# Patient Record
Sex: Male | Born: 1970 | Race: Black or African American | Hispanic: No | Marital: Single | Smoking: Never smoker
Health system: Southern US, Community
[De-identification: ages and names within clinical notes are randomized; demographics above are authoritative.]

## PROBLEM LIST (undated history)

## (undated) DIAGNOSIS — I10 Essential (primary) hypertension: Secondary | ICD-10-CM

## (undated) DIAGNOSIS — K642 Third degree hemorrhoids: Secondary | ICD-10-CM

## (undated) DIAGNOSIS — Z87828 Personal history of other (healed) physical injury and trauma: Secondary | ICD-10-CM

## (undated) DIAGNOSIS — F419 Anxiety disorder, unspecified: Secondary | ICD-10-CM

## (undated) DIAGNOSIS — G4700 Insomnia, unspecified: Secondary | ICD-10-CM

## (undated) DIAGNOSIS — K5090 Crohn's disease, unspecified, without complications: Secondary | ICD-10-CM

## (undated) DIAGNOSIS — M1990 Unspecified osteoarthritis, unspecified site: Secondary | ICD-10-CM

---
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
## 2016-05-26 DIAGNOSIS — K5090 Crohn's disease, unspecified, without complications: Secondary | ICD-10-CM | POA: Diagnosis present

## 2016-05-26 DIAGNOSIS — K642 Third degree hemorrhoids: Secondary | ICD-10-CM

## 2016-05-26 DIAGNOSIS — K648 Other hemorrhoids: Secondary | ICD-10-CM | POA: Insufficient documentation

## 2016-05-26 HISTORY — PX: FLEXIBLE SIGMOIDOSCOPY: SHX1649

## 2016-05-26 HISTORY — DX: Third degree hemorrhoids: K64.2

## 2019-07-11 ENCOUNTER — Emergency Department (HOSPITAL_COMMUNITY): Payer: Medicaid - Out of State

## 2019-07-11 ENCOUNTER — Inpatient Hospital Stay (HOSPITAL_COMMUNITY): Payer: Medicaid - Out of State

## 2019-07-11 ENCOUNTER — Encounter (HOSPITAL_COMMUNITY): Payer: Self-pay

## 2019-07-11 ENCOUNTER — Other Ambulatory Visit: Payer: Self-pay

## 2019-07-11 ENCOUNTER — Inpatient Hospital Stay (HOSPITAL_COMMUNITY)
Admission: EM | Admit: 2019-07-11 | Discharge: 2019-07-26 | DRG: 654 | Disposition: A | Discharge status: Home / Self Care | Payer: Medicaid - Out of State | Attending: General Surgery | Admitting: General Surgery

## 2019-07-11 DIAGNOSIS — Z8719 Personal history of other diseases of the digestive system: Secondary | ICD-10-CM | POA: Diagnosis not present

## 2019-07-11 DIAGNOSIS — K567 Ileus, unspecified: Secondary | ICD-10-CM | POA: Diagnosis not present

## 2019-07-11 DIAGNOSIS — Y92230 Patient room in hospital as the place of occurrence of the external cause: Secondary | ICD-10-CM | POA: Diagnosis not present

## 2019-07-11 DIAGNOSIS — R109 Unspecified abdominal pain: Secondary | ICD-10-CM | POA: Diagnosis not present

## 2019-07-11 DIAGNOSIS — T8149XA Infection following a procedure, other surgical site, initial encounter: Secondary | ICD-10-CM | POA: Diagnosis not present

## 2019-07-11 DIAGNOSIS — K50912 Crohn's disease, unspecified, with intestinal obstruction: Secondary | ICD-10-CM | POA: Diagnosis present

## 2019-07-11 DIAGNOSIS — K56699 Other intestinal obstruction unspecified as to partial versus complete obstruction: Secondary | ICD-10-CM | POA: Diagnosis present

## 2019-07-11 DIAGNOSIS — Z87898 Personal history of other specified conditions: Secondary | ICD-10-CM

## 2019-07-11 DIAGNOSIS — K648 Other hemorrhoids: Secondary | ICD-10-CM | POA: Diagnosis present

## 2019-07-11 DIAGNOSIS — R509 Fever, unspecified: Secondary | ICD-10-CM | POA: Diagnosis not present

## 2019-07-11 DIAGNOSIS — Z95828 Presence of other vascular implants and grafts: Secondary | ICD-10-CM | POA: Diagnosis not present

## 2019-07-11 DIAGNOSIS — Z4659 Encounter for fitting and adjustment of other gastrointestinal appliance and device: Secondary | ICD-10-CM

## 2019-07-11 DIAGNOSIS — K561 Intussusception: Secondary | ICD-10-CM | POA: Diagnosis present

## 2019-07-11 DIAGNOSIS — R140 Abdominal distension (gaseous): Secondary | ICD-10-CM

## 2019-07-11 DIAGNOSIS — M1990 Unspecified osteoarthritis, unspecified site: Secondary | ICD-10-CM | POA: Diagnosis present

## 2019-07-11 DIAGNOSIS — R1909 Other intra-abdominal and pelvic swelling, mass and lump: Secondary | ICD-10-CM

## 2019-07-11 DIAGNOSIS — E46 Unspecified protein-calorie malnutrition: Secondary | ICD-10-CM | POA: Diagnosis not present

## 2019-07-11 DIAGNOSIS — Z0189 Encounter for other specified special examinations: Secondary | ICD-10-CM

## 2019-07-11 DIAGNOSIS — K56609 Unspecified intestinal obstruction, unspecified as to partial versus complete obstruction: Secondary | ICD-10-CM

## 2019-07-11 DIAGNOSIS — Z20828 Contact with and (suspected) exposure to other viral communicable diseases: Secondary | ICD-10-CM | POA: Diagnosis present

## 2019-07-11 DIAGNOSIS — R066 Hiccough: Secondary | ICD-10-CM | POA: Diagnosis not present

## 2019-07-11 DIAGNOSIS — I10 Essential (primary) hypertension: Secondary | ICD-10-CM | POA: Diagnosis present

## 2019-07-11 DIAGNOSIS — R61 Generalized hyperhidrosis: Secondary | ICD-10-CM | POA: Diagnosis not present

## 2019-07-11 DIAGNOSIS — T8131XA Disruption of external operation (surgical) wound, not elsewhere classified, initial encounter: Secondary | ICD-10-CM | POA: Diagnosis not present

## 2019-07-11 DIAGNOSIS — K5090 Crohn's disease, unspecified, without complications: Secondary | ICD-10-CM | POA: Diagnosis present

## 2019-07-11 DIAGNOSIS — K565 Intestinal adhesions [bands], unspecified as to partial versus complete obstruction: Secondary | ICD-10-CM | POA: Diagnosis present

## 2019-07-11 DIAGNOSIS — N321 Vesicointestinal fistula: Principal | ICD-10-CM | POA: Diagnosis present

## 2019-07-11 DIAGNOSIS — T8140XA Infection following a procedure, unspecified, initial encounter: Secondary | ICD-10-CM | POA: Diagnosis not present

## 2019-07-11 DIAGNOSIS — T8141XA Infection following a procedure, superficial incisional surgical site, initial encounter: Secondary | ICD-10-CM | POA: Diagnosis not present

## 2019-07-11 DIAGNOSIS — Y836 Removal of other organ (partial) (total) as the cause of abnormal reaction of the patient, or of later complication, without mention of misadventure at the time of the procedure: Secondary | ICD-10-CM | POA: Diagnosis not present

## 2019-07-11 DIAGNOSIS — Z886 Allergy status to analgesic agent status: Secondary | ICD-10-CM | POA: Diagnosis not present

## 2019-07-11 DIAGNOSIS — Z87828 Personal history of other (healed) physical injury and trauma: Secondary | ICD-10-CM

## 2019-07-11 DIAGNOSIS — Z933 Colostomy status: Secondary | ICD-10-CM | POA: Diagnosis not present

## 2019-07-11 DIAGNOSIS — F411 Generalized anxiety disorder: Secondary | ICD-10-CM | POA: Diagnosis present

## 2019-07-11 DIAGNOSIS — R1013 Epigastric pain: Secondary | ICD-10-CM | POA: Diagnosis present

## 2019-07-11 DIAGNOSIS — R1900 Intra-abdominal and pelvic swelling, mass and lump, unspecified site: Secondary | ICD-10-CM | POA: Diagnosis present

## 2019-07-11 DIAGNOSIS — Z9225 Personal history of immunosupression therapy: Secondary | ICD-10-CM

## 2019-07-11 HISTORY — DX: Unspecified osteoarthritis, unspecified site: M19.90

## 2019-07-11 HISTORY — DX: Personal history of other (healed) physical injury and trauma: Z87.828

## 2019-07-11 LAB — CBC WITH DIFFERENTIAL/PLATELET
Abs Immature Granulocytes: 0.05 10*3/uL (ref 0.00–0.07)
Basophils Absolute: 0 10*3/uL (ref 0.0–0.1)
Basophils Relative: 0 %
Eosinophils Absolute: 0.1 10*3/uL (ref 0.0–0.5)
Eosinophils Relative: 0 %
HCT: 50.4 % (ref 39.0–52.0)
Hemoglobin: 16.7 g/dL (ref 13.0–17.0)
Immature Granulocytes: 0 %
Lymphocytes Relative: 2 %
Lymphs Abs: 0.4 10*3/uL — ABNORMAL LOW (ref 0.7–4.0)
MCH: 28 pg (ref 26.0–34.0)
MCHC: 33.1 g/dL (ref 30.0–36.0)
MCV: 84.6 fL (ref 80.0–100.0)
Monocytes Absolute: 1 10*3/uL (ref 0.1–1.0)
Monocytes Relative: 7 %
Neutro Abs: 13.4 10*3/uL — ABNORMAL HIGH (ref 1.7–7.7)
Neutrophils Relative %: 91 %
Platelets: 269 10*3/uL (ref 150–400)
RBC: 5.96 MIL/uL — ABNORMAL HIGH (ref 4.22–5.81)
RDW: 13.3 % (ref 11.5–15.5)
WBC: 15 10*3/uL — ABNORMAL HIGH (ref 4.0–10.5)
nRBC: 0 % (ref 0.0–0.2)

## 2019-07-11 LAB — CBC
HCT: 48.3 % (ref 39.0–52.0)
Hemoglobin: 16.3 g/dL (ref 13.0–17.0)
MCH: 28.2 pg (ref 26.0–34.0)
MCHC: 33.7 g/dL (ref 30.0–36.0)
MCV: 83.6 fL (ref 80.0–100.0)
Platelets: 253 10*3/uL (ref 150–400)
RBC: 5.78 MIL/uL (ref 4.22–5.81)
RDW: 13.2 % (ref 11.5–15.5)
WBC: 11 10*3/uL — ABNORMAL HIGH (ref 4.0–10.5)
nRBC: 0 % (ref 0.0–0.2)

## 2019-07-11 LAB — SARS CORONAVIRUS 2 (TAT 6-24 HRS): SARS Coronavirus 2: NEGATIVE

## 2019-07-11 LAB — COMPREHENSIVE METABOLIC PANEL
ALT: 18 U/L (ref 0–44)
AST: 24 U/L (ref 15–41)
Albumin: 4.2 g/dL (ref 3.5–5.0)
Alkaline Phosphatase: 60 U/L (ref 38–126)
Anion gap: 10 (ref 5–15)
BUN: 10 mg/dL (ref 6–20)
CO2: 22 mmol/L (ref 22–32)
Calcium: 9.6 mg/dL (ref 8.9–10.3)
Chloride: 108 mmol/L (ref 98–111)
Creatinine, Ser: 1.09 mg/dL (ref 0.61–1.24)
GFR calc Af Amer: >60 mL/min (ref >60)
GFR calc non Af Amer: >60 mL/min (ref >60)
Glucose, Bld: 148 mg/dL — ABNORMAL HIGH (ref 70–99)
Potassium: 4.3 mmol/L (ref 3.5–5.1)
Sodium: 140 mmol/L (ref 135–145)
Total Bilirubin: 0.8 mg/dL (ref 0.3–1.2)
Total Protein: 8.7 g/dL — ABNORMAL HIGH (ref 6.5–8.1)

## 2019-07-11 LAB — LIPASE, BLOOD: Lipase: 28 U/L (ref 11–51)

## 2019-07-11 LAB — URINALYSIS, ROUTINE W REFLEX MICROSCOPIC
Bilirubin Urine: NEGATIVE
Glucose, UA: NEGATIVE mg/dL
Hgb urine dipstick: NEGATIVE
Ketones, ur: 20 mg/dL — AB
Leukocytes,Ua: NEGATIVE
Nitrite: NEGATIVE
Protein, ur: NEGATIVE mg/dL
Specific Gravity, Urine: >1.046 — ABNORMAL HIGH (ref 1.005–1.030)
pH: 5 (ref 5.0–8.0)

## 2019-07-11 MED ORDER — MORPHINE SULFATE (PF) 4 MG/ML IV SOLN
4.0000 mg | Freq: Once | INTRAVENOUS | Status: AC
  Administered 2019-07-11: 4 mg via INTRAVENOUS
  Filled 2019-07-11: qty 1

## 2019-07-11 MED ORDER — METHOCARBAMOL 1000 MG/10ML IJ SOLN
500.0000 mg | Freq: Three times a day (TID) | INTRAVENOUS | Status: DC
  Administered 2019-07-11 – 2019-07-12 (×3): 500 mg via INTRAVENOUS
  Filled 2019-07-11 (×3): qty 5
  Filled 2019-07-11 (×3): qty 500
  Filled 2019-07-11 (×2): qty 5

## 2019-07-11 MED ORDER — ENOXAPARIN SODIUM 40 MG/0.4ML ~~LOC~~ SOLN: 40.0000 mg | SUBCUTANEOUS | Status: DC

## 2019-07-11 MED ORDER — SODIUM CHLORIDE 0.9 % IV SOLN
INTRAVENOUS | Status: DC
  Administered 2019-07-11: 1000 mL via INTRAVENOUS
  Administered 2019-07-11 – 2019-07-12 (×4): via INTRAVENOUS

## 2019-07-11 MED ORDER — ENOXAPARIN SODIUM 40 MG/0.4ML ~~LOC~~ SOLN
40.0000 mg | Freq: Every day | SUBCUTANEOUS | Status: DC
  Administered 2019-07-11: 40 mg via SUBCUTANEOUS
  Filled 2019-07-11: qty 0.4

## 2019-07-11 MED ORDER — HYDRALAZINE HCL 20 MG/ML IJ SOLN
10.0000 mg | INTRAMUSCULAR | Status: AC | PRN
  Administered 2019-07-12 – 2019-07-21 (×4): 10 mg via INTRAVENOUS
  Filled 2019-07-11 (×4): qty 1

## 2019-07-11 MED ORDER — MORPHINE SULFATE (PF) 2 MG/ML IV SOLN
1.0000 mg | INTRAVENOUS | Status: DC | PRN
  Administered 2019-07-11 (×2): 2 mg via INTRAVENOUS
  Filled 2019-07-11 (×2): qty 1

## 2019-07-11 MED ORDER — ACETAMINOPHEN 650 MG RE SUPP: 650.0000 mg | Freq: Four times a day (QID) | RECTAL | Status: DC | PRN

## 2019-07-11 MED ORDER — ONDANSETRON HCL 4 MG/2ML IJ SOLN
4.0000 mg | Freq: Four times a day (QID) | INTRAMUSCULAR | Status: DC | PRN
  Administered 2019-07-12 – 2019-07-20 (×2): 4 mg via INTRAVENOUS
  Filled 2019-07-11 (×2): qty 2

## 2019-07-11 MED ORDER — OXYCODONE HCL 5 MG PO TABS: 5.0000 mg | ORAL_TABLET | ORAL | Status: DC | PRN

## 2019-07-11 MED ORDER — SODIUM CHLORIDE 0.9 % IV BOLUS
1000.0000 mL | Freq: Once | INTRAVENOUS | Status: AC
  Administered 2019-07-11: 1000 mL via INTRAVENOUS

## 2019-07-11 MED ORDER — ONDANSETRON 4 MG PO TBDP: 4.0000 mg | ORAL_TABLET | Freq: Four times a day (QID) | ORAL | Status: DC | PRN

## 2019-07-11 MED ORDER — SODIUM CHLORIDE (PF) 0.9 % IJ SOLN
INTRAMUSCULAR | Status: AC
  Filled 2019-07-11: qty 50

## 2019-07-11 MED ORDER — METHOCARBAMOL 500 MG PO TABS: 500.0000 mg | ORAL_TABLET | Freq: Four times a day (QID) | ORAL | Status: DC | PRN

## 2019-07-11 MED ORDER — FAMOTIDINE IN NACL 20-0.9 MG/50ML-% IV SOLN
20.0000 mg | Freq: Once | INTRAVENOUS | Status: AC
  Administered 2019-07-11: 20 mg via INTRAVENOUS
  Filled 2019-07-11: qty 50

## 2019-07-11 MED ORDER — ACETAMINOPHEN 325 MG PO TABS: 650.0000 mg | ORAL_TABLET | Freq: Four times a day (QID) | ORAL | Status: DC | PRN

## 2019-07-11 MED ORDER — METOCLOPRAMIDE HCL 5 MG/ML IJ SOLN
10.0000 mg | Freq: Once | INTRAMUSCULAR | Status: AC
  Administered 2019-07-11: 10 mg via INTRAVENOUS
  Filled 2019-07-11: qty 2

## 2019-07-11 MED ORDER — DIPHENHYDRAMINE HCL 50 MG/ML IJ SOLN: 25.0000 mg | Freq: Four times a day (QID) | INTRAMUSCULAR | Status: DC | PRN

## 2019-07-11 MED ORDER — ONDANSETRON HCL 4 MG/2ML IJ SOLN
4.0000 mg | Freq: Once | INTRAMUSCULAR | Status: AC | PRN
  Administered 2019-07-11: 4 mg via INTRAVENOUS
  Filled 2019-07-11: qty 2

## 2019-07-11 MED ORDER — ACETAMINOPHEN 10 MG/ML IV SOLN
1000.0000 mg | Freq: Four times a day (QID) | INTRAVENOUS | Status: AC
  Administered 2019-07-11 – 2019-07-12 (×3): 1000 mg via INTRAVENOUS
  Filled 2019-07-11 (×4): qty 100

## 2019-07-11 MED ORDER — IOHEXOL 300 MG/ML  SOLN
100.0000 mL | Freq: Once | INTRAMUSCULAR | Status: AC | PRN
  Administered 2019-07-11: 100 mL via INTRAVENOUS

## 2019-07-11 MED ORDER — DIPHENHYDRAMINE HCL 50 MG/ML IJ SOLN
25.0000 mg | Freq: Once | INTRAMUSCULAR | Status: AC
  Administered 2019-07-11: 25 mg via INTRAVENOUS
  Filled 2019-07-11: qty 1

## 2019-07-11 MED ORDER — PANTOPRAZOLE SODIUM 40 MG IV SOLR
40.0000 mg | Freq: Every day | INTRAVENOUS | Status: DC
  Administered 2019-07-11: 40 mg via INTRAVENOUS
  Filled 2019-07-11: qty 40

## 2019-07-11 MED ORDER — MORPHINE SULFATE (PF) 2 MG/ML IV SOLN
1.0000 mg | INTRAVENOUS | Status: DC | PRN
  Administered 2019-07-11 – 2019-07-12 (×6): 3 mg via INTRAVENOUS
  Administered 2019-07-12: 2 mg via INTRAVENOUS
  Filled 2019-07-11 (×5): qty 2
  Filled 2019-07-11: qty 1
  Filled 2019-07-11: qty 2

## 2019-07-11 MED ORDER — DIPHENHYDRAMINE HCL 25 MG PO CAPS: 25.0000 mg | ORAL_CAPSULE | Freq: Four times a day (QID) | ORAL | Status: DC | PRN
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
## 2019-07-11 NOTE — ED Provider Notes (Signed)
[facility]-EMERGENCY DEPT Provider Note   CSN: [id number] Arrival date & time: 07/11/19  0101    History   Chief Complaint Chief Complaint  Patient presents with   Abdominal Pain   Emesis    HPI Kyle Hampton is a 48 y.o. male.   The history is provided by the patient.  Abdominal Pain Associated symptoms: vomiting   Emesis Associated symptoms: abdominal pain   He has no significant past history and comes in complaining of severe epigastric pain with some radiation to the back.  Pain started about 9 AM.  Nothing makes it better, nothing makes it worse.  He did eat a little bit for lunch without affecting the pain.  There has been associated nausea and vomiting.  Emesis does not affect the pain.  He rates pain a 10/10.  He came in by ambulance and was given fentanyl and ondansetron.  Fentanyl gave him temporary relief of pain but it is coming back now.  Ondansetron did not help the nausea.  He is a non-smoker and denies current ethanol use.  He denies recent NSAID use.  No past medical history on file.  There are no active problems to display for this patient.   ** The histories are not reviewed yet. Please review them in the "History" navigator section and refresh this SmartLink.      Home Medications    Prior to Admission medications   Not on File    Family History No family history on file.  Social History Social History   Tobacco Use   Smoking status: Not on file  Substance Use Topics   Alcohol use: Not on file   Drug use: Not on file     Allergies   Patient has no allergy information on record.   Review of Systems Review of Systems  Gastrointestinal: Positive for abdominal pain and vomiting.  All other systems reviewed and are negative.    Physical Exam Updated Vital Signs BP (!) 144/97    Pulse 63    Temp 98.3 F (36.8 C) (Oral)    Resp 18    SpO2 96%   Physical Exam Vitals signs and nursing note reviewed.    48  year old male, uncomfortable and in obvious pain, but in no acute distress. Vital signs are significant for elevated blood pressure. Oxygen saturation is 96%, which is normal. Head is normocephalic and atraumatic. PERRLA, EOMI. Oropharynx is clear. Neck is nontender and supple without adenopathy or JVD. Back is nontender and there is no CVA tenderness. Lungs are clear without rales, wheezes, or rhonchi. Chest is nontender. Heart has regular rate and rhythm without murmur. Abdomen is soft, flat, with marked epigastric tenderness.  There is no rebound or guarding.  There are no masses or hepatosplenomegaly and peristalsis is hypoactive. Extremities have no cyanosis or edema, full range of motion is present. Skin is warm and dry without rash. Neurologic: Mental status is normal, cranial nerves are intact, there are no motor or sensory deficits.  ED Treatments / Results  Labs (all labs ordered are listed, but only abnormal results are displayed) Labs Reviewed  COMPREHENSIVE METABOLIC PANEL - Abnormal; Notable for the following components:      Result Value   Glucose, Bld 148 (*)    Total Protein 8.7 (*)    All other components within normal limits  CBC - Abnormal; Notable for the following components:   WBC 11.0 (*)    All other components within normal  limits  URINALYSIS, ROUTINE W REFLEX MICROSCOPIC - Abnormal; Notable for the following components:   Specific Gravity, Urine >1.046 (*)    Ketones, ur 20 (*)    All other components within normal limits  LIPASE, BLOOD  CBC WITH DIFFERENTIAL/PLATELET    Radiology Ct Abdomen Pelvis W Contrast  Result Date: 07/11/2019 CLINICAL DATA:  Abdominal pain with diverticulitis suspected EXAM: CT ABDOMEN AND PELVIS WITH CONTRAST TECHNIQUE: Multidetector CT imaging of the abdomen and pelvis was performed using the standard protocol following bolus administration of intravenous contrast. CONTRAST:  158mL OMNIPAQUE IOHEXOL 300 MG/ML  SOLN COMPARISON:   None. FINDINGS: Lower chest:  No contributory findings. Hepatobiliary: Mildly heterogeneous liver density with possible 2 tiny hypodense liver nodules marked on sagittal reformats.No evidence of biliary obstruction or stone. Pancreas: Unremarkable. Spleen: Unremarkable. Adrenals/Urinary Tract: Negative adrenals. No hydronephrosis or stone. Pelvic mass involves the bladder dome which is distorted and upwardly retracted. Stomach/Bowel: High-grade bowel obstruction with diffuse fluid filling and distension leading to a transition point in the pelvis where the ileocecal valve region and a ileal loop is distorted due to a spiculated mass in the pelvis which also involves the bladder dome. The mass measures up to 5 cm on coronal reformats. No evident perforation. No skip areas of inflammation. Thickened appearance of the appendix to 9 mm but no associated inflammation. No mass calcification. Short segment enteroenteric intussusception in the left abdomen without evident underlying mass, likely transient and reactive to the obstruction. Vascular/Lymphatic: No significant vascular finding. Negative for adenopathy. Reproductive:Negative Other: No ascites or pneumoperitoneum. Musculoskeletal: No acute abnormalities. IMPRESSION: 1. High-grade small bowel obstruction due to spiculated mass involving ileum, ileocecal valve region, and bladder dome. Carcinoma or carcinoid are primary considerations. 2. No adenopathy. Possible 2 tiny liver nodules, attention on follow-up. 3. Short segment enteroenteric intussusception in the left abdomen, likely transient. Electronically Signed   By: Monte Fantasia M.D.   On: 07/11/2019 04:19    Procedures Procedures  Medications Ordered in ED Medications  sodium chloride (PF) 0.9 % injection (has no administration in time range)  morphine 4 MG/ML injection 4 mg (has no administration in time range)  ondansetron (ZOFRAN) injection 4 mg (4 mg Intravenous Given 07/11/19 0157)  sodium  chloride 0.9 % bolus 1,000 mL (1,000 mLs Intravenous New Bag/Given 07/11/19 0239)  morphine 4 MG/ML injection 4 mg (4 mg Intravenous Given 07/11/19 0243)  metoCLOPramide (REGLAN) injection 10 mg (10 mg Intravenous Given 07/11/19 0243)  diphenhydrAMINE (BENADRYL) injection 25 mg (25 mg Intravenous Given 07/11/19 0243)  famotidine (PEPCID) IVPB 20 mg premix (0 mg Intravenous Stopped 07/11/19 0312)  iohexol (OMNIPAQUE) 300 MG/ML solution 100 mL (100 mLs Intravenous Contrast Given 07/11/19 0352)     Initial Impression / Assessment and Plan / ED Course  I have reviewed the triage vital signs and the nursing notes.  Pertinent labs & imaging results that were available during my care of the patient were reviewed by me and considered in my medical decision making (see chart for details).  Acute epigastric pain of uncertain cause.  Consider peptic ulcer disease, biliary tract disease, pancreatitis, diverticulitis.  He has no old records in the [facility] health system.  Screening labs are obtained and he will be sent for CT of the abdomen.  In the meantime, he is given IV fluids, metoclopramide, famotidine.  CT scan shows evidence of high-grade obstruction and spiculated mass involving the distal ileum and ileocecal valve consistent with carcinoma or carcinoid tumor.  Patient had good relief  of pain with above-noted treatment but started to have recurrence of pain and is given additional morphine.  Case is discussed with Dr. Gerrit FriendsGerkin of [facility] who agrees to evaluate the patient for possible admission.  Final Clinical Impressions(s) / ED Diagnoses   Final diagnoses:  SBO (small bowel obstruction) (HCC)  Other intra-abdominal and pelvic swelling, mass and lump    ED Discharge Orders    None       Dione BoozeGlick, Eryka Dolinger, MD 07/11/19 [phone]

## 2019-07-11 NOTE — ED Notes (Signed)
Gave report to Vicente Males, Therapist, sports for room 1404.

## 2019-07-11 NOTE — ED Notes (Signed)
Spoke with Ronalee Belts, [place] who states NG tube is to stay in place until surgery tomorrow. Will medicate with pain medication at this time.

## 2019-07-11 NOTE — ED Triage Notes (Signed)
Pt arriving via [facility] for abdominal pain and N/V. Pt reports a "cramping/burning sensation". Pt received 188mcg Fentanyl, 4mg  Zofran, and 554mL normal saline from EMS prior to arrival. Pt sleeping upon arrival.

## 2019-07-11 NOTE — ED Notes (Signed)
Truck keys and hotel key card given to father outside of ED entrance, at the request of pt.

## 2019-07-11 NOTE — ED Notes (Signed)
Pt resting in bed at this time, NAD noted and awaiting possible surgery. Pt advised he will not be able to have more pain medication at this time.

## 2019-07-11 NOTE — ED Notes (Signed)
Call placed to speak with admitting physician due to patients pain and wanting NG tube removed. Visitor at bedside.

## 2019-07-11 NOTE — ED Notes (Signed)
Pt resting in bed at this time, NAD noted and eyes closed/equal non-labored respirations.

## 2019-07-11 NOTE — H&P (Addendum)
[facility] Admission Note  Kyle Hampton 01-16-1971  [id number].    Requesting MD: Dr. Preston FleetingGlick Chief Complaint/Reason for Consult: SBO/intra-abdominal mass  HPI: Kyle Hampton is a 48 y.o. male with a history of HTN who presented to the ED with abdominal pain.  Patient reportedly began having severe, constant epigastric pain that radiated to the right side of his abdomen yesterday with associated nausea and greater than 5 episodes of nonbilious emesis.  He reports his symptoms worse with palpation and movement.  He was unable to try any interventions prior to arrival.  He denies history of similar symptoms in the past.  In the ED he was found to have a high-grade small bowel obstruction likely secondary to a spiculated mass involving the ileum, ileocecal valve region and bladder dome with concerns for carcinoma.  There is also a short segment of enterioenteric intussusception that was likely transient.  Patient reports that he follows with a GI doctor, Dr. Carmin MuskratMurali, who reportedly saw this mass on colonoscopy a few months ago.  He reports he was supposed to start "clinical trials" on Monday.  He gave me his GI doctors number but after attempts x4 I have not been able to reach him.  He reports he is not passing any flatus.  He has not had a BM in the last 2 days. He has had some decreased urinary output. No prior abdominal surgeries.  He is not taking blood thinners.  Addendum: After speaking with Dr. Carmin MuskratMurali of GI, it appears the patient has had the diagnosis of Crohn's since the age of 48. He previously was on Humaria but failed this treatment and was supposed to be starting a new medication next Monday. He recently underwent a colonoscopy that showed a stricture at the site where the mass is located on CT. He recommends proceeding with surgery if we feel it is clinically necessary.   ROS: Review of Systems  Constitutional: Negative for chills and fever.  Respiratory: Negative for cough and  shortness of breath.   Cardiovascular: Negative for chest pain and leg swelling.  Gastrointestinal: Positive for abdominal pain, constipation, nausea and vomiting. Negative for blood in stool, diarrhea and melena.  Genitourinary: Negative for dysuria, frequency and urgency.       Decreased urine output  All other systems reviewed and are negative. All systems reviewed and otherwise negative except for as above  No family history on file.  History reviewed. No pertinent past medical history. Hypertension  Past Surgical History:  Procedure Laterality Date  . FLEXIBLE SIGMOIDOSCOPY  05/26/2016   3 internal prolapsed hemorrhoids banded    Social History:  has no history on file for tobacco, alcohol, and drug.  Patient denies any alcohol use Patient denies any drug use Patient denies any tobacco use Patient works in [employer] He lives at home with his father  Allergies: Not on File  (Not in a hospital admission)   Prior to Admission medications   Not on File    Blood pressure (!) 178/92, pulse 60, temperature 98.3 F (36.8 C), temperature source Oral, resp. rate 20, SpO2 97 %. Physical Exam: General: pleasant, WD/WN male who is laying in bed in NAD HEENT: head is normocephalic, atraumatic.  Sclera are noninjected.  Pupils equal and round.  Ears and nose without any masses or lesions.  Mouth is pink and moist. Dentition fair Heart: regular, rate, and rhythm.  No obvious murmurs, gallops, or rubs noted.  Palpable pedal pulses bilaterally Lungs: CTAB, no wheezes, rhonchi,  or rales noted.  Respiratory effort nonlabored Abd: Soft, mildly distended with generalized tenderness that is worst in the epigastrium, RUQ, RLQ without r/r/g or signs of peritonitis. No palpable mass. +BS. No hernias, or organomegaly MS: all 4 extremities are symmetrical with no cyanosis, clubbing, or edema. Skin: warm and dry with no masses, lesions, or rashes Psych: A&Ox3 with an appropriate  affect. Neuro: cranial nerves grossly intact, extremity CSM intact bilaterally, normal speech  Results for orders placed or performed during the hospital encounter of 07/11/19 (from the past 48 hour(s))  Lipase, blood     Status: None   Collection Time: 07/11/19  1:19 AM  Result Value Ref Range   Lipase 28 11 - 51 U/L    Comment: Performed at [facility], [place] [address] [place], [place] [address]  Comprehensive metabolic panel     Status: Abnormal   Collection Time: 07/11/19  1:19 AM  Result Value Ref Range   Sodium 140 135 - 145 mmol/L   Potassium 4.3 3.5 - 5.1 mmol/L   Chloride 108 98 - 111 mmol/L   CO2 22 22 - 32 mmol/L   Glucose, Bld 148 (H) 70 - 99 mg/dL   BUN 10 6 - 20 mg/dL   Creatinine, Ser 1.09 0.61 - 1.24 mg/dL   Calcium 9.6 8.9 - 10.3 mg/dL   Total Protein 8.7 (H) 6.5 - 8.1 g/dL   Albumin 4.2 3.5 - 5.0 g/dL   AST 24 15 - 41 U/L   ALT 18 0 - 44 U/L   Alkaline Phosphatase 60 38 - 126 U/L   Total Bilirubin 0.8 0.3 - 1.2 mg/dL   GFR calc non Af Amer >60 >60 mL/min   GFR calc Af Amer >60 >60 mL/min   Anion gap 10 5 - 15    Comment: Performed at [facility], [place] [address], [place], [place] [address]  CBC     Status: Abnormal   Collection Time: 07/11/19  1:19 AM  Result Value Ref Range   WBC 11.0 (H) 4.0 - 10.5 K/uL   RBC 5.78 4.22 - 5.81 MIL/uL   Hemoglobin 16.3 13.0 - 17.0 g/dL   HCT 48.3 39.0 - 52.0 %   MCV 83.6 80.0 - 100.0 fL   MCH 28.2 26.0 - 34.0 pg   MCHC 33.7 30.0 - 36.0 g/dL   RDW 13.2 11.5 - 15.5 %   Platelets 253 150 - 400 K/uL   nRBC 0.0 0.0 - 0.2 %    Comment: Performed at [facility], [place] [address] [place], [place] [address]  Urinalysis, Routine w reflex microscopic     Status: Abnormal   Collection Time: 07/11/19  5:30 AM  Result Value Ref Range   Color, Urine YELLOW YELLOW   APPearance CLEAR CLEAR   Specific Gravity, Urine >1.046 (H) 1.005 - 1.030   pH 5.0 5.0 - 8.0   Glucose, UA  NEGATIVE NEGATIVE mg/dL   Hgb urine dipstick NEGATIVE NEGATIVE   Bilirubin Urine NEGATIVE NEGATIVE   Ketones, ur 20 (A) NEGATIVE mg/dL   Protein, ur NEGATIVE NEGATIVE mg/dL   Nitrite NEGATIVE NEGATIVE   Leukocytes,Ua NEGATIVE NEGATIVE    Comment: Performed at [place] [address], [place], [place] [address]   Ct Abdomen Pelvis W Contrast  Result Date: 07/11/2019 CLINICAL DATA:  Abdominal pain with diverticulitis suspected EXAM: CT ABDOMEN AND PELVIS WITH CONTRAST TECHNIQUE: Multidetector CT imaging of the abdomen and pelvis was performed using  the standard protocol following bolus administration of intravenous contrast. CONTRAST:  100mL OMNIPAQUE IOHEXOL 300 MG/ML  SOLN COMPARISON:  None. FINDINGS: Lower chest:  No contributory findings. Hepatobiliary: Mildly heterogeneous liver density with possible 2 tiny hypodense liver nodules marked on sagittal reformats.No evidence of biliary obstruction or stone. Pancreas: Unremarkable. Spleen: Unremarkable. Adrenals/Urinary Tract: Negative adrenals. No hydronephrosis or stone. Pelvic mass involves the bladder dome which is distorted and upwardly retracted. Stomach/Bowel: High-grade bowel obstruction with diffuse fluid filling and distension leading to a transition point in the pelvis where the ileocecal valve region and a ileal loop is distorted due to a spiculated mass in the pelvis which also involves the bladder dome. The mass measures up to 5 cm on coronal reformats. No evident perforation. No skip areas of inflammation. Thickened appearance of the appendix to 9 mm but no associated inflammation. No mass calcification. Short segment enteroenteric intussusception in the left abdomen without evident underlying mass, likely transient and reactive to the obstruction. Vascular/Lymphatic: No significant vascular finding. Negative for adenopathy. Reproductive:Negative Other: No ascites or pneumoperitoneum. Musculoskeletal: No acute  abnormalities. IMPRESSION: 1. High-grade small bowel obstruction due to spiculated mass involving ileum, ileocecal valve region, and bladder dome. Carcinoma or carcinoid are primary considerations. 2. No adenopathy. Possible 2 tiny liver nodules, attention on follow-up. 3. Short segment enteroenteric intussusception in the left abdomen, likely transient. Electronically Signed   By: Marnee SpringJonathon  Watts M.D.   On: 07/11/2019 04:19    Anti-infectives (From admission, onward)   None      Assessment/Plan HTN - PRN meds  Hx of Crohn's previously on Humira  SBO 2/2 intra-abdominal mass - CT with high-grade small bowel obstruction due to spiculated mass involving ileum, ileocecal valve region, and bladder dome - Bowel rest, NGT, IVF  - Will plan for ex-lap tomorrow with Dr. Carolynne Edouardoth. Will discuss with Urology given involvement with bladder dome.    ID - None  VTE - SCDs, Lovenox FEN - NPO  Jacinto HalimMichael M Charliegh Vasudevan, [facility] 07/11/2019, 9:38 AM Pager: [phone]

## 2019-07-11 NOTE — ED Notes (Signed)
NG tube place and XR at bedside for conformation.

## 2019-07-11 NOTE — ED Notes (Signed)
Pt moved to hospital bed at this time and no complaints expressed. Call bell in reach and IVF infusing at continued rate. No complications at site noted.

## 2019-07-11 NOTE — ED Notes (Signed)
Notified transport to move patient upstairs.

## 2019-07-12 ENCOUNTER — Encounter (HOSPITAL_COMMUNITY): Payer: Self-pay | Admitting: *Deleted

## 2019-07-12 ENCOUNTER — Inpatient Hospital Stay (HOSPITAL_COMMUNITY): Payer: Medicaid - Out of State | Admitting: Certified Registered"

## 2019-07-12 ENCOUNTER — Encounter (HOSPITAL_COMMUNITY): Admission: EM | Disposition: A | Discharge status: Home / Self Care | Payer: Self-pay | Source: Home / Self Care

## 2019-07-12 DIAGNOSIS — M1990 Unspecified osteoarthritis, unspecified site: Secondary | ICD-10-CM | POA: Diagnosis present

## 2019-07-12 DIAGNOSIS — Z87898 Personal history of other specified conditions: Secondary | ICD-10-CM

## 2019-07-12 DIAGNOSIS — Z87828 Personal history of other (healed) physical injury and trauma: Secondary | ICD-10-CM

## 2019-07-12 DIAGNOSIS — R1900 Intra-abdominal and pelvic swelling, mass and lump, unspecified site: Secondary | ICD-10-CM | POA: Diagnosis present

## 2019-07-12 DIAGNOSIS — K56609 Unspecified intestinal obstruction, unspecified as to partial versus complete obstruction: Secondary | ICD-10-CM | POA: Diagnosis present

## 2019-07-12 DIAGNOSIS — I10 Essential (primary) hypertension: Secondary | ICD-10-CM | POA: Diagnosis present

## 2019-07-12 DIAGNOSIS — K56699 Other intestinal obstruction unspecified as to partial versus complete obstruction: Secondary | ICD-10-CM | POA: Diagnosis present

## 2019-07-12 DIAGNOSIS — Z9225 Personal history of immunosupression therapy: Secondary | ICD-10-CM

## 2019-07-12 HISTORY — PX: BLADDER REPAIR: SHX76

## 2019-07-12 HISTORY — PX: ILEOCECETOMY: SHX5857

## 2019-07-12 HISTORY — PX: LAPAROTOMY: SHX154

## 2019-07-12 HISTORY — PX: COLECTOMY WITH COLOSTOMY CREATION/HARTMANN PROCEDURE: SHX6598

## 2019-07-12 LAB — CBC
HCT: 51 % (ref 39.0–52.0)
Hemoglobin: 16.7 g/dL (ref 13.0–17.0)
MCH: 28.1 pg (ref 26.0–34.0)
MCHC: 32.7 g/dL (ref 30.0–36.0)
MCV: 85.7 fL (ref 80.0–100.0)
Platelets: 243 10*3/uL (ref 150–400)
RBC: 5.95 MIL/uL — ABNORMAL HIGH (ref 4.22–5.81)
RDW: 13.5 % (ref 11.5–15.5)
WBC: 4.1 10*3/uL (ref 4.0–10.5)
nRBC: 0 % (ref 0.0–0.2)

## 2019-07-12 LAB — BASIC METABOLIC PANEL
Anion gap: 11 (ref 5–15)
BUN: 16 mg/dL (ref 6–20)
CO2: 23 mmol/L (ref 22–32)
Calcium: 9.3 mg/dL (ref 8.9–10.3)
Chloride: 106 mmol/L (ref 98–111)
Creatinine, Ser: 0.92 mg/dL (ref 0.61–1.24)
GFR calc Af Amer: >60 mL/min (ref >60)
GFR calc non Af Amer: >60 mL/min (ref >60)
Glucose, Bld: 134 mg/dL — ABNORMAL HIGH (ref 70–99)
Potassium: 3.7 mmol/L (ref 3.5–5.1)
Sodium: 140 mmol/L (ref 135–145)

## 2019-07-12 LAB — MRSA PCR SCREENING: MRSA by PCR: NEGATIVE

## 2019-07-12 LAB — HIV ANTIBODY (ROUTINE TESTING W REFLEX): HIV Screen 4th Generation wRfx: NONREACTIVE

## 2019-07-12 SURGERY — LAPAROTOMY, EXPLORATORY
Anesthesia: General | Site: Abdomen

## 2019-07-12 MED ORDER — MEPERIDINE HCL 50 MG/ML IJ SOLN: 6.2500 mg | INTRAMUSCULAR | Status: DC | PRN

## 2019-07-12 MED ORDER — MIDAZOLAM HCL 2 MG/2ML IJ SOLN
INTRAMUSCULAR | Status: AC
  Filled 2019-07-12: qty 2

## 2019-07-12 MED ORDER — ROCURONIUM BROMIDE 10 MG/ML (PF) SYRINGE
PREFILLED_SYRINGE | INTRAVENOUS | Status: AC
  Filled 2019-07-12: qty 10

## 2019-07-12 MED ORDER — CEFAZOLIN SODIUM-DEXTROSE 2-3 GM-%(50ML) IV SOLR
INTRAVENOUS | Status: DC | PRN
  Administered 2019-07-12: 2 g via INTRAVENOUS

## 2019-07-12 MED ORDER — METHOCARBAMOL 1000 MG/10ML IJ SOLN
1000.0000 mg | Freq: Four times a day (QID) | INTRAVENOUS | Status: DC | PRN
  Administered 2019-07-12 – 2019-07-13 (×3): 1000 mg via INTRAVENOUS
  Filled 2019-07-12 (×7): qty 10

## 2019-07-12 MED ORDER — CEFAZOLIN SODIUM-DEXTROSE 2-4 GM/100ML-% IV SOLN
INTRAVENOUS | Status: AC
  Filled 2019-07-12: qty 100

## 2019-07-12 MED ORDER — ENALAPRILAT 1.25 MG/ML IV SOLN
0.6250 mg | Freq: Four times a day (QID) | INTRAVENOUS | Status: DC | PRN
  Filled 2019-07-12: qty 1

## 2019-07-12 MED ORDER — ENOXAPARIN SODIUM 40 MG/0.4ML ~~LOC~~ SOLN
40.0000 mg | SUBCUTANEOUS | Status: DC
  Administered 2019-07-13 – 2019-07-26 (×14): 40 mg via SUBCUTANEOUS
  Filled 2019-07-12 (×14): qty 0.4

## 2019-07-12 MED ORDER — ONDANSETRON HCL 4 MG/2ML IJ SOLN
INTRAMUSCULAR | Status: AC
  Filled 2019-07-12: qty 2

## 2019-07-12 MED ORDER — MENTHOL 3 MG MT LOZG
1.0000 | LOZENGE | OROMUCOSAL | Status: DC | PRN
  Filled 2019-07-12: qty 9

## 2019-07-12 MED ORDER — LACTATED RINGERS IV SOLN
INTRAVENOUS | Status: DC
  Administered 2019-07-12 (×3): via INTRAVENOUS

## 2019-07-12 MED ORDER — ONDANSETRON HCL 4 MG/2ML IJ SOLN
INTRAMUSCULAR | Status: DC | PRN
  Administered 2019-07-12: 4 mg via INTRAVENOUS

## 2019-07-12 MED ORDER — SODIUM CHLORIDE 0.9 % IR SOLN
Status: DC | PRN
  Administered 2019-07-12 (×3): 1000 mL

## 2019-07-12 MED ORDER — ACETAMINOPHEN 650 MG RE SUPP: 650.0000 mg | Freq: Four times a day (QID) | RECTAL | Status: DC | PRN

## 2019-07-12 MED ORDER — HYDROMORPHONE HCL 2 MG/ML IJ SOLN
INTRAMUSCULAR | Status: AC
  Administered 2019-07-12: 1 mg
  Filled 2019-07-12: qty 1

## 2019-07-12 MED ORDER — ROCURONIUM BROMIDE 10 MG/ML (PF) SYRINGE
PREFILLED_SYRINGE | INTRAVENOUS | Status: DC | PRN
  Administered 2019-07-12: 20 mg via INTRAVENOUS
  Administered 2019-07-12: 50 mg via INTRAVENOUS
  Administered 2019-07-12: 30 mg via INTRAVENOUS
  Administered 2019-07-12: 20 mg via INTRAVENOUS
  Administered 2019-07-12 (×2): 10 mg via INTRAVENOUS
  Administered 2019-07-12: 20 mg via INTRAVENOUS

## 2019-07-12 MED ORDER — HYDROMORPHONE HCL 1 MG/ML IJ SOLN
0.2500 mg | INTRAMUSCULAR | Status: DC | PRN
  Administered 2019-07-12 (×3): 0.5 mg via INTRAVENOUS

## 2019-07-12 MED ORDER — FENTANYL CITRATE (PF) 100 MCG/2ML IJ SOLN: INTRAMUSCULAR | Status: DC | PRN

## 2019-07-12 MED ORDER — DEXAMETHASONE SODIUM PHOSPHATE 10 MG/ML IJ SOLN
INTRAMUSCULAR | Status: DC | PRN
  Administered 2019-07-12: 8 mg via INTRAVENOUS

## 2019-07-12 MED ORDER — PROPOFOL 10 MG/ML IV BOLUS
INTRAVENOUS | Status: AC
  Filled 2019-07-12: qty 20

## 2019-07-12 MED ORDER — SIMETHICONE 40 MG/0.6ML PO SUSP
40.0000 mg | Freq: Four times a day (QID) | ORAL | Status: DC | PRN
  Administered 2019-07-19 – 2019-07-25 (×12): 40 mg via ORAL
  Filled 2019-07-12 (×14): qty 0.6

## 2019-07-12 MED ORDER — SUCCINYLCHOLINE CHLORIDE 200 MG/10ML IV SOSY
PREFILLED_SYRINGE | INTRAVENOUS | Status: AC
  Filled 2019-07-12: qty 10

## 2019-07-12 MED ORDER — LACTATED RINGERS IV BOLUS: 1000.0000 mL | Freq: Three times a day (TID) | INTRAVENOUS | Status: AC | PRN

## 2019-07-12 MED ORDER — CHLORHEXIDINE GLUCONATE CLOTH 2 % EX PADS
6.0000 | MEDICATED_PAD | Freq: Every day | CUTANEOUS | Status: DC
  Administered 2019-07-13 – 2019-07-19 (×5): 6 via TOPICAL

## 2019-07-12 MED ORDER — HYDROMORPHONE HCL 1 MG/ML IJ SOLN
INTRAMUSCULAR | Status: AC
  Administered 2019-07-12: 1 mg
  Filled 2019-07-12: qty 2

## 2019-07-12 MED ORDER — LIP MEDEX EX OINT
1.0000 "application " | TOPICAL_OINTMENT | Freq: Two times a day (BID) | CUTANEOUS | Status: DC
  Administered 2019-07-13 – 2019-07-26 (×23): 1 via TOPICAL
  Filled 2019-07-12 (×3): qty 7

## 2019-07-12 MED ORDER — PROMETHAZINE HCL 25 MG/ML IJ SOLN: 6.2500 mg | INTRAMUSCULAR | Status: DC | PRN

## 2019-07-12 MED ORDER — MIDAZOLAM HCL 2 MG/2ML IJ SOLN
INTRAMUSCULAR | Status: DC | PRN
  Administered 2019-07-12: 2 mg via INTRAVENOUS

## 2019-07-12 MED ORDER — SUCCINYLCHOLINE CHLORIDE 200 MG/10ML IV SOSY
PREFILLED_SYRINGE | INTRAVENOUS | Status: DC | PRN
  Administered 2019-07-12: 120 mg via INTRAVENOUS

## 2019-07-12 MED ORDER — FENTANYL CITRATE (PF) 250 MCG/5ML IJ SOLN
INTRAMUSCULAR | Status: DC | PRN
  Administered 2019-07-12 (×8): 50 ug via INTRAVENOUS

## 2019-07-12 MED ORDER — PROCHLORPERAZINE EDISYLATE 10 MG/2ML IJ SOLN: 5.0000 mg | INTRAMUSCULAR | Status: DC | PRN

## 2019-07-12 MED ORDER — METOPROLOL TARTRATE 5 MG/5ML IV SOLN
5.0000 mg | Freq: Four times a day (QID) | INTRAVENOUS | Status: DC | PRN
  Administered 2019-07-12 – 2019-07-19 (×2): 5 mg via INTRAVENOUS
  Filled 2019-07-12 (×4): qty 5

## 2019-07-12 MED ORDER — ACETAMINOPHEN 325 MG PO TABS: 650.0000 mg | ORAL_TABLET | Freq: Four times a day (QID) | ORAL | Status: DC | PRN

## 2019-07-12 MED ORDER — HYDROMORPHONE HCL 1 MG/ML IJ SOLN
0.5000 mg | INTRAMUSCULAR | Status: DC | PRN
  Administered 2019-07-12: 1 mg via INTRAVENOUS
  Administered 2019-07-12 – 2019-07-13 (×5): 2 mg via INTRAVENOUS
  Filled 2019-07-12 (×5): qty 2

## 2019-07-12 MED ORDER — LORAZEPAM 2 MG/ML IJ SOLN
0.5000 mg | Freq: Three times a day (TID) | INTRAMUSCULAR | Status: DC | PRN
  Administered 2019-07-21 – 2019-07-25 (×8): 1 mg via INTRAVENOUS
  Filled 2019-07-12 (×8): qty 1

## 2019-07-12 MED ORDER — SUGAMMADEX SODIUM 200 MG/2ML IV SOLN
INTRAVENOUS | Status: DC | PRN
  Administered 2019-07-12: 200 mg via INTRAVENOUS

## 2019-07-12 MED ORDER — MAGIC MOUTHWASH
15.0000 mL | Freq: Four times a day (QID) | ORAL | Status: DC | PRN
  Filled 2019-07-12: qty 15

## 2019-07-12 MED ORDER — LABETALOL HCL 5 MG/ML IV SOLN
INTRAVENOUS | Status: AC
  Filled 2019-07-12: qty 4

## 2019-07-12 MED ORDER — DEXAMETHASONE SODIUM PHOSPHATE 10 MG/ML IJ SOLN
INTRAMUSCULAR | Status: AC
  Filled 2019-07-12: qty 1

## 2019-07-12 MED ORDER — DIPHENHYDRAMINE HCL 50 MG/ML IJ SOLN: 12.5000 mg | Freq: Four times a day (QID) | INTRAMUSCULAR | Status: DC | PRN

## 2019-07-12 MED ORDER — FENTANYL CITRATE (PF) 250 MCG/5ML IJ SOLN
INTRAMUSCULAR | Status: AC
  Filled 2019-07-12: qty 5

## 2019-07-12 MED ORDER — LIDOCAINE 2% (20 MG/ML) 5 ML SYRINGE
INTRAMUSCULAR | Status: AC
  Filled 2019-07-12: qty 5

## 2019-07-12 MED ORDER — PHENOL 1.4 % MT LIQD: 2.0000 | OROMUCOSAL | Status: DC | PRN

## 2019-07-12 MED ORDER — FENTANYL CITRATE (PF) 100 MCG/2ML IJ SOLN
INTRAMUSCULAR | Status: AC
  Filled 2019-07-12: qty 2

## 2019-07-12 MED ORDER — PANTOPRAZOLE SODIUM 40 MG IV SOLR
40.0000 mg | Freq: Two times a day (BID) | INTRAVENOUS | Status: DC
  Administered 2019-07-12 – 2019-07-20 (×17): 40 mg via INTRAVENOUS
  Filled 2019-07-12 (×17): qty 40

## 2019-07-12 MED ORDER — MORPHINE SULFATE (PF) 2 MG/ML IV SOLN
1.0000 mg | INTRAVENOUS | Status: DC | PRN
  Administered 2019-07-12 (×2): 2 mg via INTRAVENOUS
  Filled 2019-07-12: qty 2

## 2019-07-12 MED ORDER — ALBUMIN HUMAN 5 % IV SOLN
INTRAVENOUS | Status: DC | PRN
  Administered 2019-07-12: 12:00:00 via INTRAVENOUS

## 2019-07-12 MED ORDER — POVIDONE-IODINE 10 % EX OINT
TOPICAL_OINTMENT | CUTANEOUS | Status: AC
  Filled 2019-07-12: qty 28.35

## 2019-07-12 MED ORDER — LORAZEPAM 2 MG/ML IJ SOLN: 0.5000 mg | Freq: Three times a day (TID) | INTRAMUSCULAR | Status: DC | PRN

## 2019-07-12 MED ORDER — PROPOFOL 10 MG/ML IV BOLUS
INTRAVENOUS | Status: DC | PRN
  Administered 2019-07-12: 200 mg via INTRAVENOUS

## 2019-07-12 MED ORDER — LIDOCAINE 2% (20 MG/ML) 5 ML SYRINGE
INTRAMUSCULAR | Status: DC | PRN
  Administered 2019-07-12: 60 mg via INTRAVENOUS

## 2019-07-12 MED ORDER — LABETALOL HCL 5 MG/ML IV SOLN
INTRAVENOUS | Status: DC | PRN
  Administered 2019-07-12 (×3): 5 mg via INTRAVENOUS

## 2019-07-12 SURGICAL SUPPLY — 53 items
APPLICATOR COTTON TIP 6 STRL (MISCELLANEOUS) ×1 IMPLANT
APPLICATOR COTTON TIP 6IN STRL (MISCELLANEOUS) ×2
BLADE EXTENDED COATED 6.5IN (ELECTRODE) ×2 IMPLANT
BLADE HEX COATED 2.75 (ELECTRODE) ×2 IMPLANT
COVER MAYO STAND STRL (DRAPES) IMPLANT
COVER WAND RF STERILE (DRAPES) IMPLANT
DRAIN CHANNEL 19F RND (DRAIN) ×2 IMPLANT
DRAPE LAPAROSCOPIC ABDOMINAL (DRAPES) ×2 IMPLANT
DRAPE WARM FLUID 44X44 (DRAPES) IMPLANT
DRSG OPSITE POSTOP 4X10 (GAUZE/BANDAGES/DRESSINGS) ×2 IMPLANT
DRSG OPSITE POSTOP 4X8 (GAUZE/BANDAGES/DRESSINGS) ×2 IMPLANT
DRSG TEGADERM 2-3/8X2-3/4 SM (GAUZE/BANDAGES/DRESSINGS) ×2 IMPLANT
ELECT REM PT RETURN 15FT ADLT (MISCELLANEOUS) ×2 IMPLANT
EVACUATOR SILICONE 100CC (DRAIN) ×2 IMPLANT
GAUZE SPONGE 4X4 12PLY STRL (GAUZE/BANDAGES/DRESSINGS) ×2 IMPLANT
GLOVE BIO SURGEON STRL SZ7.5 (GLOVE) ×4 IMPLANT
GLOVE BIOGEL PI IND STRL 7.0 (GLOVE) ×1 IMPLANT
GLOVE BIOGEL PI INDICATOR 7.0 (GLOVE) ×1
GOWN STRL REUS W/ TWL XL LVL3 (GOWN DISPOSABLE) ×1 IMPLANT
GOWN STRL REUS W/TWL LRG LVL3 (GOWN DISPOSABLE) ×2 IMPLANT
GOWN STRL REUS W/TWL XL LVL3 (GOWN DISPOSABLE) ×3 IMPLANT
HANDLE SUCTION POOLE (INSTRUMENTS) IMPLANT
KIT BASIN OR (CUSTOM PROCEDURE TRAY) ×2 IMPLANT
KIT TURNOVER KIT A (KITS) IMPLANT
LIGASURE IMPACT 36 18CM CVD LR (INSTRUMENTS) ×2 IMPLANT
NS IRRIG 1000ML POUR BTL (IV SOLUTION) ×2 IMPLANT
PACK GENERAL/GYN (CUSTOM PROCEDURE TRAY) ×2 IMPLANT
POUCH DRAINABLE 1PC 2 1/4 FLAT (OSTOMY) ×2 IMPLANT
RELOAD PROXIMATE 75MM BLUE (ENDOMECHANICALS) ×10 IMPLANT
RELOAD PROXIMATE TA60MM BLUE (ENDOMECHANICALS) ×6 IMPLANT
SPONGE DRAIN TRACH 4X4 STRL 2S (GAUZE/BANDAGES/DRESSINGS) ×2 IMPLANT
SPONGE LAP 18X18 RF (DISPOSABLE) IMPLANT
STAPLER GUN LINEAR PROX 60 (STAPLE) ×4 IMPLANT
STAPLER PROXIMATE 75MM BLUE (STAPLE) ×2 IMPLANT
STAPLER VISISTAT 35W (STAPLE) ×4 IMPLANT
SUCTION POOLE HANDLE (INSTRUMENTS)
SUT ETHILON 3 0 PS 1 (SUTURE) ×2 IMPLANT
SUT PDS AB 1 CTX 36 (SUTURE) IMPLANT
SUT PDS AB 1 TP1 96 (SUTURE) ×4 IMPLANT
SUT PROLENE 2 0 SH DA (SUTURE) ×2 IMPLANT
SUT SILK 2 0 (SUTURE)
SUT SILK 2 0 SH CR/8 (SUTURE) IMPLANT
SUT SILK 2-0 18XBRD TIE 12 (SUTURE) IMPLANT
SUT SILK 3 0 (SUTURE)
SUT SILK 3 0 SH CR/8 (SUTURE) IMPLANT
SUT SILK 3-0 18XBRD TIE 12 (SUTURE) IMPLANT
SUT VIC AB 3-0 SH 27 (SUTURE) ×2
SUT VIC AB 3-0 SH 27X BRD (SUTURE) ×2 IMPLANT
SUT VIC AB 3-0 SH 8-18 (SUTURE) ×4 IMPLANT
TOWEL OR 17X26 10 PK STRL BLUE (TOWEL DISPOSABLE) ×4 IMPLANT
TRAY FOLEY MTR SLVR 16FR STAT (SET/KITS/TRAYS/PACK) IMPLANT
WATER STERILE IRR 1000ML POUR (IV SOLUTION) ×2 IMPLANT
YANKAUER SUCT BULB TIP NO VENT (SUCTIONS) IMPLANT

## 2019-07-12 NOTE — Progress Notes (Signed)
Follow up paged to [place] office. Pt has a small amount of dark red tinged output from NGT. Pre surgical RN updated also.

## 2019-07-12 NOTE — Progress Notes (Signed)
Call placed t0 patients father Kyle Hampton with bed placement update.

## 2019-07-12 NOTE — Anesthesia Preprocedure Evaluation (Signed)
Anesthesia Evaluation  Patient identified by MRN, date of birth, ID band Patient awake    Reviewed: Allergy & Precautions, NPO status , Patient's Chart, lab work & pertinent test results  Airway Mallampati: II  TM Distance: >3 FB Neck ROM: Full    Dental  (+) Dental Advisory Given   Pulmonary neg pulmonary ROS,    Pulmonary exam normal breath sounds clear to auscultation       Cardiovascular negative cardio ROS Normal cardiovascular exam Rhythm:Regular Rate:Normal     Neuro/Psych negative neurological ROS  negative psych ROS   GI/Hepatic negative GI ROS, Neg liver ROS,   Endo/Other  negative endocrine ROS  Renal/GU negative Renal ROS     Musculoskeletal  (+) Arthritis ,   Abdominal   Peds  Hematology negative hematology ROS (+)   Anesthesia Other Findings   Reproductive/Obstetrics negative OB ROS                             Anesthesia Physical Anesthesia Plan  ASA: II  Anesthesia Plan: General   Post-op Pain Management:    Induction: Intravenous  PONV Risk Score and Plan: 4 or greater and Ondansetron, Dexamethasone, Midazolam and Treatment may vary due to age or medical condition  Airway Management Planned: Oral ETT  Additional Equipment:   Intra-op Plan:   Post-operative Plan: Extubation in OR  Informed Consent: I have reviewed the patients History and Physical, chart, labs and discussed the procedure including the risks, benefits and alternatives for the proposed anesthesia with the patient or authorized representative who has indicated his/her understanding and acceptance.     Dental advisory given  Plan Discussed with: CRNA  Anesthesia Plan Comments:         Anesthesia Quick Evaluation

## 2019-07-12 NOTE — Consult Note (Signed)
[facility] Nurse ostomy consult note Notification of new stoma created emergently (Dr. Marlou Starks) received.  Will see Friday, 07/14/19.  [facility] nursing team will follow for ostomy care and teaching, and will remain available to this patient, the nursing, surgical and medical teams.    Thanks, Maudie Flakes, MSN, RN, [place], Arther Abbott  Pager# [phone]

## 2019-07-12 NOTE — Interval H&P Note (Signed)
History and Physical Interval Note:  07/12/2019 10:58 AM  Kyle Hampton  has presented today for surgery, with the diagnosis of INTRAABDOMINAL MASS.  The various methods of treatment have been discussed with the patient and family. After consideration of risks, benefits and other options for treatment, the patient has consented to  Procedure(s): EXPLORATORY LAPAROTOMY WITH POSSIBLE REMOVAL OF INTRA-ABDOMINAL MASS, POSSIBLE SMALL BOWEL RESECTION, POSSIBLE PARTIAL COLECTOMY, POSSIBLE COLOSTOMY CREATION, POSSIBLE BLADDER REPAIR (N/A) as a surgical intervention.  The patient's history has been reviewed, patient examined, no change in status, stable for surgery.  I have reviewed the patient's chart and labs.  Questions were answered to the patient's satisfaction.     Autumn Messing III

## 2019-07-12 NOTE — Anesthesia Procedure Notes (Signed)
Procedure Name: Intubation Date/Time: 07/12/2019 11:42 AM Performed by: Niel Hummer, CRNA Pre-anesthesia Checklist: Patient being monitored, Suction available, Emergency Drugs available and Patient identified Patient Re-evaluated:Patient Re-evaluated prior to induction Oxygen Delivery Method: Circle system utilized Preoxygenation: Pre-oxygenation with 100% oxygen Induction Type: IV induction, Rapid sequence and Cricoid Pressure applied Laryngoscope Size: Mac and 4 Grade View: Grade I Tube type: Oral Tube size: 7.5 mm Number of attempts: 1 Airway Equipment and Method: Stylet Placement Confirmation: ETT inserted through vocal cords under direct vision,  positive ETCO2 and breath sounds checked- equal and bilateral Secured at: 23 cm Tube secured with: Tape Dental Injury: Teeth and Oropharynx as per pre-operative assessment

## 2019-07-12 NOTE — Op Note (Signed)
07/11/2019 - 07/12/2019  2:37 PM  PATIENT:  Kyle Hampton  48 y.o. male  PRE-OPERATIVE DIAGNOSIS:  INTRAABDOMINAL MASS WITH SMALL BOWEL OBSTRUCTION  POST-OPERATIVE DIAGNOSIS:  INTRAABDOMINAL MASS WITH SMALL BOWEL OBSTRUCTION  PROCEDURE:  Procedure(s): EXPLORATORY LAPAROTOMY WITH REMOVAL OF INTRA-ABDOMINAL MASS, ILEOCECECTOMY, SIGMOID COLECTOMY WITH LEFT END COLOSTOMY, PARTIAL CYSTECTOMY WITH REPAIR OF BLADDER  SURGEON:  Surgeon(s) and Role:    * Jovita Kussmaul, MD - Primary    * Ardis Hughs, MD - Assisting    * Clovis Riley, MD - Assisting    * White, Sharon Mt, MD - Assisting  PHYSICIAN ASSISTANT:   ASSISTANTS: Dr. Dema Severin and Dr. Kae Heller   ANESTHESIA:   general  EBL:  600 mL   BLOOD ADMINISTERED:none  DRAINS: (1) Jackson-Pratt drain(s) with closed bulb suction in the pelvis   LOCAL MEDICATIONS USED:  NONE  SPECIMEN:  Source of Specimen:  termial ileum and cecum, sigmoid colon, mass with wall of bladder  DISPOSITION OF SPECIMEN:  PATHOLOGY  COUNTS:  YES  TOURNIQUET:  * No tourniquets in log *  DICTATION: .Dragon Dictation   After informed consent was obtained the patient was brought to the operating room and placed in the supine position on the operating table.  After adequate induction of general anesthesia the patient's abdomen was prepped with ChloraPrep, allowed to dry, and draped in usual sterile manner.  A lower midline incision was made with a 10 blade knife.  The incision was carried through the skin and subcutaneous tissue sharply with the electrocautery until the linea alba was identified.  The linea was also incised with the electrocautery.  The preperitoneal space was probed bluntly with a hemostat until the peritoneum was opened and access was gained to the abdominal cavity.  The rest of the incision was opened under direct vision.  The abdomen was generally inspected and no other abnormalities were noted except for the mass in the pelvis and dilated  proximal small bowel.  The mass appeared to be involving the cecum and terminal ileum as well as the transverse colon and dome of the bladder.  There were some loops of small bowel that were tethered down in the pelvis by adhesions and these were lysed sharply with Metzenbaum scissors.  I then chose a site above the area of obstruction in the terminal ileum for division of the small bowel.  The mesentery at this point was opened sharply with the electrocautery.  A GIA-75 stapler was then placed across the bowel at this point, clamped, and fired thereby dividing the small bowel between staple lines.  The transverse colon was also densely adherent to this mass.  We were able to free the transverse colon off the mass by dividing the sidewall of the transverse colon with a TA 60 stapler.  The right colon was then mobilized by incising its retroperitoneal attachment and the white line of Toldt.  A site was chosen along the mid right colon where the colon seem normal.  This area was opened sharply with the electrocautery and divided with a single firing of a GIA-75 stapler.  We were then able to dissected the mass and cecum out of the pelvis by combination of blunt finger dissection and some sharp dissection with the electrocautery.  The mass was removed from the dome of the bladder with some of the wall of the bladder sharply with the electrocautery.  Once this was accomplished we were able to remove the mass.  We did look  for the ureter during the dissection and had urology with us and did not see the ureter during the dissection.  Once this was accomplished we were able to remove the mass from the patient.  The bladder was repaired by the urologist and his portion will be dictated separately.  The small bowel and right colon did appear to reach each other easily without any tension and so we did decide to create an anastomosis at that point.  A small opening was made on the antimesenteric surface of the distal small  bowel and right colon.  A each limb of a GIA-75 stapler was then placed down the appropriate limb of small bowel or colon, clamped, and fired thereby creating a nice widely patent enteroenterostomy.  The common opening was closed with a single firing of a TA 60 stapler.  The staple line was reinforced with 2-0 silk Lembert stitches including a 2-0 silk crotch stitch.  The mesenteric defect was closed with interrupted 3-0 silk stitches.  Next the attention was turned to the sigmoid colon.  This area was significantly narrowed by the TA 60 stapler so we decided to divide the sigmoid colon above and below this area with firings of a GIA-75 stapler.  The distal colon was marked with 2-0 Prolene stitches.  The proximal colon easily reached out through the abdominal wall.  A site was chosen to create an ostomy.  A circular portion of skin and subcutaneous tissue were removed sharply with the electrocautery.  A cruciate incision was made in the fascia of the anterior abdominal wall to allow 2 fingers through.  A Tanja PortBabcock was then placed through this opening and used to grasp the staple line of the proximal colon and bring it out through the abdominal wall without difficulty.  Next the abdomen was irrigated with copious amounts of saline.  A right lower quadrant drain was placed under direct vision through the abdominal wall and placed in the pelvis.  The rest of the surfaces of the abdominal cavity were examined and no other abnormalities were noted.  Next the fascia of the anterior abdominal wall was closed with 2 running #1 double-stranded looped PDS sutures.  The subcutaneous tissue was irrigated with saline.  The skin was closed with staples and sterile dressings were applied.  The ostomy was then matured and the staple line was removed.  The ostomy was matured with 3-0 Vicryl stitches.  An ostomy appliance was applied.  The ostomy appeared healthy at this point.  The patient tolerated the procedure well.  At the end of  the case all needle sponge and instrument counts were correct.  The patient was then awakened and taken to recovery in stable condition.  PLAN OF CARE: Admit to inpatient   PATIENT DISPOSITION:  PACU - hemodynamically stable.   Delay start of Pharmacological VTE agent (>24hrs) due to surgical blood loss or risk of bleeding: no

## 2019-07-12 NOTE — Progress Notes (Signed)
On assessment this am, patient complains of worsening nausea and heart burn. [facility] provider paged with answering service.

## 2019-07-12 NOTE — Anesthesia Postprocedure Evaluation (Signed)
Anesthesia Post Note  Patient: Kyle Hampton  Procedure(s) Performed: exploratory laparatomy  with sigmoid colectomy, ileocecectomy end left colostomy, partial cystectomy with repair of bladder (N/A Abdomen)     Patient location during evaluation: PACU Anesthesia Type: General Level of consciousness: awake and alert Pain management: pain level controlled Vital Signs Assessment: post-procedure vital signs reviewed and stable Respiratory status: spontaneous breathing, nonlabored ventilation and respiratory function stable Cardiovascular status: stable Postop Assessment: no apparent nausea or vomiting Anesthetic complications: no    Last Vitals:  Vitals:   07/12/19 1550 07/12/19 1600  BP: (!) 159/100 (!) 156/103  Pulse: 74 82  Resp: 20 14  Temp: 37 C   SpO2: 100% 99%                 Audry Pili

## 2019-07-12 NOTE — Transfer of Care (Signed)
Immediate Anesthesia Transfer of Care Note  Patient: Kyle Hampton  Procedure(s) Performed: exploratory laparatomy  with sigmoid colectomy, ileocecectomy end left colostomy, partial cystectomy with repair of bladder (N/A Abdomen)  Patient Location: PACU  Anesthesia Type:General  Level of Consciousness: awake  Airway & Oxygen Therapy: Patient Spontanous Breathing and Patient connected to face mask oxygen  Post-op Assessment: Report given to RN and Post -op Vital signs reviewed and stable  Post vital signs: Reviewed and stable  Last Vitals:  Vitals Value Taken Time  BP 160/105 07/12/19 1457  Temp    Pulse 73 07/12/19 1459  Resp 17 07/12/19 1459  SpO2 100 % 07/12/19 1459  Vitals shown include unvalidated device data.  Last Pain:  Vitals:   07/12/19 1010  TempSrc: Oral  PainSc:       Patients Stated Pain Goal: 3 (75/30/05 1102)  Complications: No apparent anesthesia complications

## 2019-07-12 NOTE — Op Note (Signed)
Preoperative diagnosis:  1. Pelvic mass   Postoperative diagnosis:  1. same  Procedure: 1. Cystotomy closure  Surgeon: Ardis Hughs, MD  Anesthesia: General  Complications: None  Intraoperative findings:  The mass was involving the cecum, ileum, sigmoid, and anterior bladder wall.  We are able to peel the mass off of the dome of the bladder without really entering into the bladder lumen.  EBL: Minimal  Specimens: None  Indication: Kyle Hampton is a 48 y.o. patient with small bowel obstruction secondary to a pelvic mass which was also invading into the anterior bladder wall/dome.  After reviewing the management options for treatment, he elected to proceed with the above surgical procedure(s). We have discussed the potential benefits and risks of the procedure, side effects of the proposed treatment, the likelihood of the patient achieving the goals of the procedure, and any potential problems that might occur during the procedure or recuperation. Informed consent has been obtained.  Description of procedure:  The patient was taken to the operating room and general anesthesia was induced.  He was then prepped and draped in the usual sterile fashion, and preoperative antibiotics were administered. A preoperative time-out was performed.  Foley catheter was placed.  Please see Dr. Ethlyn Gallery dictation for the complete case.  I was able to assist the Dr. Marlou Starks with the resection of the mass off of the detrusor wall.  This was done by coming underneath the mass and we are able to shave the mass off the detrusor without entering into the lumen.  However, there were areas where there was very thin detrusor lining.  The area of resection was imbricated using 3-0 Vicryl in a running fashion.  2 layers were used to close the defect.  Please see the Dr. Ethlyn Gallery note for the remainder of the operation.  Ardis Hughs, M.D.

## 2019-07-13 ENCOUNTER — Encounter (HOSPITAL_COMMUNITY): Payer: Self-pay | Admitting: General Surgery

## 2019-07-13 MED ORDER — ORAL CARE MOUTH RINSE
15.0000 mL | Freq: Two times a day (BID) | OROMUCOSAL | Status: DC
  Administered 2019-07-13 – 2019-07-26 (×21): 15 mL via OROMUCOSAL

## 2019-07-13 MED ORDER — KCL IN DEXTROSE-NACL 20-5-0.9 MEQ/L-%-% IV SOLN
INTRAVENOUS | Status: AC
  Administered 2019-07-13 – 2019-07-21 (×19): via INTRAVENOUS
  Filled 2019-07-13 (×22): qty 1000

## 2019-07-13 MED ORDER — SODIUM CHLORIDE 0.9% FLUSH: 9.0000 mL | INTRAVENOUS | Status: DC | PRN

## 2019-07-13 MED ORDER — ONDANSETRON HCL 4 MG/2ML IJ SOLN: 4.0000 mg | Freq: Four times a day (QID) | INTRAMUSCULAR | Status: DC | PRN

## 2019-07-13 MED ORDER — DIPHENHYDRAMINE HCL 50 MG/ML IJ SOLN
12.5000 mg | Freq: Four times a day (QID) | INTRAMUSCULAR | Status: DC | PRN
  Administered 2019-07-14: 12.5 mg via INTRAVENOUS
  Filled 2019-07-13: qty 1

## 2019-07-13 MED ORDER — DIPHENHYDRAMINE HCL 12.5 MG/5ML PO ELIX: 12.5000 mg | ORAL_SOLUTION | Freq: Four times a day (QID) | ORAL | Status: DC | PRN

## 2019-07-13 MED ORDER — HYDROMORPHONE 1 MG/ML IV SOLN
INTRAVENOUS | Status: DC
  Administered 2019-07-13: 0.6 mg via INTRAVENOUS
  Administered 2019-07-13: 0.5 mg via INTRAVENOUS
  Administered 2019-07-13: 4.09 mg via INTRAVENOUS
  Administered 2019-07-13: 1.5 mg via INTRAVENOUS
  Administered 2019-07-14 (×2): 0.6 mg via INTRAVENOUS
  Administered 2019-07-14: 1.2 mg via INTRAVENOUS
  Administered 2019-07-14: 1.4 mg via INTRAVENOUS
  Administered 2019-07-14: 1.8 mg via INTRAVENOUS
  Administered 2019-07-15: 1.5 mg via INTRAVENOUS
  Administered 2019-07-15: 0.9 mg via INTRAVENOUS
  Administered 2019-07-15: 2.1 mg via INTRAVENOUS
  Administered 2019-07-16: 1.5 mg via INTRAVENOUS
  Administered 2019-07-16: 0.9 mg via INTRAVENOUS
  Administered 2019-07-16: 1.5 mg via INTRAVENOUS
  Administered 2019-07-16: 0.6 mg via INTRAVENOUS
  Administered 2019-07-16: 30 mg via INTRAVENOUS
  Administered 2019-07-16 (×2): 1.2 mg via INTRAVENOUS
  Administered 2019-07-16: 1.5 mg via INTRAVENOUS
  Administered 2019-07-17: 1.2 mg via INTRAVENOUS
  Administered 2019-07-17 (×2): 2.1 mg via INTRAVENOUS
  Administered 2019-07-17: 2.4 mg via INTRAVENOUS
  Administered 2019-07-18: 1.8 mg via INTRAVENOUS
  Administered 2019-07-18: 0.3 mg via INTRAVENOUS
  Administered 2019-07-18: 2.7 mg via INTRAVENOUS
  Administered 2019-07-19: 0 mg via INTRAVENOUS
  Administered 2019-07-19: 2.1 mg via INTRAVENOUS
  Administered 2019-07-19: 0.9 mg via INTRAVENOUS
  Administered 2019-07-19: 30 mg via INTRAVENOUS
  Administered 2019-07-19: 0.9 mg via INTRAVENOUS
  Administered 2019-07-20: 0.3 mg via INTRAVENOUS
  Administered 2019-07-20: 0.6 mg via INTRAVENOUS
  Administered 2019-07-20: 0.9 mg via INTRAVENOUS
  Filled 2019-07-13 (×3): qty 30

## 2019-07-13 MED ORDER — SODIUM CHLORIDE 0.9 % IV SOLN
5.0000 mg | Freq: Four times a day (QID) | INTRAVENOUS | Status: DC | PRN
  Administered 2019-07-13 – 2019-07-16 (×9): 5 mg via INTRAVENOUS
  Filled 2019-07-13 (×9): qty 0.2

## 2019-07-13 MED ORDER — NALOXONE HCL 0.4 MG/ML IJ SOLN: 0.4000 mg | INTRAMUSCULAR | Status: DC | PRN

## 2019-07-13 MED ORDER — ACETAMINOPHEN 10 MG/ML IV SOLN
1000.0000 mg | Freq: Four times a day (QID) | INTRAVENOUS | Status: AC
  Administered 2019-07-13 – 2019-07-14 (×4): 1000 mg via INTRAVENOUS
  Filled 2019-07-13 (×4): qty 100
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
## 2019-07-13 NOTE — Progress Notes (Signed)
1 Day Post-Op    CC:  Subjective: The patient complains of pain in the abdomen.  Objective: Vital signs in last 24 hours: Temp:  [97.7 F (36.5 C)-99.1 F (37.3 C)] 98.5 F (36.9 C) (08/27 0400) Pulse Rate:  [59-120] 120 (08/27 0600) Resp:  [0-22] 14 (08/27 0600) BP: (153-180)/(92-114) 175/92 (08/27 0600) SpO2:  [87 %-100 %] 87 % (08/27 0600) Weight:  [88.1 kg] 88.1 kg (08/26 1600) Last BM Date: 07/10/19  Intake/Output from previous day: 08/26 0701 - 08/27 0700 In: 3419.6 [I.V.:3069.6; IV Piggyback:350] Out: 2280 [Urine:1115; Emesis/NG output:400; Drains:165; Blood:600] Intake/Output this shift: No intake/output data recorded.  General appearance: alert and cooperative Resp: clear to auscultation bilaterally Cardio: regular rate and rhythm GI: The abdomen is soft with moderate tenderness.  The incision looks good.  The ostomy is pink.  Lab Results:  Recent Labs    07/11/19 1016 07/12/19 0519  WBC 15.0* 4.1  HGB 16.7 16.7  HCT 50.4 51.0  PLT 269 243    BMET Recent Labs    07/11/19 0119 07/12/19 0519  NA 140 140  K 4.3 3.7  CL 108 106  CO2 22 23  GLUCOSE 148* 134*  BUN 10 16  CREATININE 1.09 0.92  CALCIUM 9.6 9.3   PT/INR No results for input(s): LABPROT, INR in the last 72 hours.  Recent Labs  Lab 07/11/19 0119  AST 24  ALT 18  ALKPHOS 60  BILITOT 0.8  PROT 8.7*  ALBUMIN 4.2     Lipase     Component Value Date/Time   LIPASE 28 07/11/2019 0119     Medications: . Chlorhexidine Gluconate Cloth  6 each Topical Daily  . enoxaparin (LOVENOX) injection  40 mg Subcutaneous Q24H  . lip balm  1 application Topical BID  . mouth rinse  15 mL Mouth Rinse BID  . pantoprazole (PROTONIX) IV  40 mg Intravenous Q12H   . sodium chloride 125 mL/hr at 07/12/19 1800  . lactated ringers    . methocarbamol (ROBAXIN) IV 1,000 mg (07/12/19 2125)   Anti-infectives (From admission, onward)   Start     Dose/Rate Route Frequency Ordered Stop   07/12/19 1018   ceFAZolin (ANCEF) 2-4 GM/100ML-% IVPB    Note to Pharmacy: Charmayne Sheer   : cabinet override      07/12/19 1018 07/12/19 2229      Assessment/Plan Hx Crohn's disease - previously on Humira Hypertension  Intraabdominal mass with SBO Exploratory laparotomy, ileocecetomy, partial colectomy and colostomy, 07/12/19,Dr. Marlou Starks  FEN:  NPO/IV fluids ID:  Ancef pre op DVT:  Lovenox @1600  today Follow up:  Dr. Marlou Starks POC: Kenechukwu, Eckstein Father   [phone]     Postop day 1 Continue NG and bowel rest Continue Foley until urology says it is okay to remove. Out of bed to chair today Await pathology  LOS: 2 days    Kyle Hampton,Kyle Hampton 07/13/2019 [id number]

## 2019-07-14 LAB — CBC
HCT: 40.3 % (ref 39.0–52.0)
Hemoglobin: 12.7 g/dL — ABNORMAL LOW (ref 13.0–17.0)
MCH: 28 pg (ref 26.0–34.0)
MCHC: 31.5 g/dL (ref 30.0–36.0)
MCV: 88.8 fL (ref 80.0–100.0)
Platelets: 218 10*3/uL (ref 150–400)
RBC: 4.54 MIL/uL (ref 4.22–5.81)
RDW: 13.6 % (ref 11.5–15.5)
WBC: 8.7 10*3/uL (ref 4.0–10.5)
nRBC: 0 % (ref 0.0–0.2)

## 2019-07-14 LAB — BASIC METABOLIC PANEL
Anion gap: 5 (ref 5–15)
BUN: 16 mg/dL (ref 6–20)
CO2: 26 mmol/L (ref 22–32)
Calcium: 8.5 mg/dL — ABNORMAL LOW (ref 8.9–10.3)
Chloride: 111 mmol/L (ref 98–111)
Creatinine, Ser: 1.14 mg/dL (ref 0.61–1.24)
GFR calc Af Amer: >60 mL/min (ref >60)
GFR calc non Af Amer: >60 mL/min (ref >60)
Glucose, Bld: 112 mg/dL — ABNORMAL HIGH (ref 70–99)
Potassium: 3.9 mmol/L (ref 3.5–5.1)
Sodium: 142 mmol/L (ref 135–145)

## 2019-07-14 MED ORDER — SUCRALFATE 1 GM/10ML PO SUSP
1.0000 g | Freq: Three times a day (TID) | ORAL | Status: DC
  Administered 2019-07-14 – 2019-07-25 (×31): 1 g via ORAL
  Filled 2019-07-14 (×35): qty 10

## 2019-07-14 NOTE — Consult Note (Signed)
[facility] Nurse ostomy consult note Stoma type/location: Left sided colostomy. Umbilicus is beneath honeycomb dressing so I am unable to tell if it is in the upper or lower quadrant. Stomal assessment/size: 2 inches, edematous, red, moist, raised with os at center Peristomal assessment: intact, clear Treatment options for stomal/peristomal skin: skin barrier ring Output: scant serosanguinous, no flatus Ostomy pouching: 2pc. 2 and 3/4 inch pouching system with skin barrier ring. Education provided: None today.  Patient is ill, with NGT and relentless hiccups. Complaining of pain all over. Enrolled patient in [facility] program: No  WOC nursing team will follow, and will remain available to this patient, the nursing and medical teams.  Next scheduled visit is on Monday, 07/17/19. One of my partners will see in my absence.   Thanks, Maudie Flakes, MSN, RN, [place], Arther Abbott  Pager# [phone]

## 2019-07-14 NOTE — Progress Notes (Signed)
Patient walked in hallway @100  ft with front wheel walker. Patient up in chair after walk and reported that he felt "gurgles" bowel sounds were heard in right upper and lower quadrants.

## 2019-07-14 NOTE — Progress Notes (Signed)
2 Days Post-Op   Subjective/Chief Complaint: Complains of pain all over   Objective: Vital signs in last 24 hours: Temp:  [97.8 F (36.6 C)-100.5 F (38.1 C)] 97.8 F (36.6 C) (08/28 0446) Pulse Rate:  [76-114] 76 (08/28 0446) Resp:  [13-20] 17 (08/28 0815) BP: (144-184)/(87-114) 148/95 (08/28 0446) SpO2:  [92 %-99 %] 95 % (08/28 0815) Last BM Date: 07/10/19  Intake/Output from previous day: 08/27 0701 - 08/28 0700 In: 4555.4 [P.O.:250; I.V.:3721.2; IV Piggyback:584.2] Out: 2432 [Urine:2202; Emesis/NG output:170; Drains:60] Intake/Output this shift: No intake/output data recorded.  General appearance: alert and cooperative Resp: clear to auscultation bilaterally Cardio: regular rate and rhythm GI: soft, appropriately tender. incision looks good. ostomy pink  Lab Results:  Recent Labs    07/12/19 0519 07/14/19 0804  WBC 4.1 8.7  HGB 16.7 12.7*  HCT 51.0 40.3  PLT 243 218   BMET Recent Labs    07/12/19 0519 07/14/19 0804  NA 140 142  K 3.7 3.9  CL 106 111  CO2 23 26  GLUCOSE 134* 112*  BUN 16 16  CREATININE 0.92 1.14  CALCIUM 9.3 8.5*   PT/INR No results for input(s): LABPROT, INR in the last 72 hours. ABG No results for input(s): PHART, HCO3 in the last 72 hours.  Invalid input(s): PCO2, PO2  Studies/Results: No results found.  Anti-infectives: Anti-infectives (From admission, onward)   Start     Dose/Rate Route Frequency Ordered Stop   07/12/19 1018  ceFAZolin (ANCEF) 2-4 GM/100ML-% IVPB    Note to Pharmacy: Charmayne Sheer   : cabinet override      07/12/19 1018 07/12/19 2229      Assessment/Plan: s/p Procedure(s): exploratory laparatomy  with sigmoid colectomy, ileocecectomy end left colostomy, partial cystectomy with repair of bladder (N/A) continue ng and bowel rest until bowel function returns.  Ambulate Continue foley per urology Await path  LOS: 3 days    Autumn Messing III 07/14/2019

## 2019-07-15 ENCOUNTER — Inpatient Hospital Stay (HOSPITAL_COMMUNITY): Payer: Medicaid - Out of State

## 2019-07-15 LAB — URINALYSIS, ROUTINE W REFLEX MICROSCOPIC
Bacteria, UA: NONE SEEN
Bilirubin Urine: NEGATIVE
Glucose, UA: NEGATIVE mg/dL
Ketones, ur: NEGATIVE mg/dL
Leukocytes,Ua: NEGATIVE
Nitrite: NEGATIVE
Protein, ur: 100 mg/dL — AB
RBC / HPF: >50 RBC/hpf — ABNORMAL HIGH (ref 0–5)
Specific Gravity, Urine: 1.029 (ref 1.005–1.030)
pH: 5 (ref 5.0–8.0)

## 2019-07-15 LAB — BASIC METABOLIC PANEL
Anion gap: 11 (ref 5–15)
BUN: 11 mg/dL (ref 6–20)
CO2: 22 mmol/L (ref 22–32)
Calcium: 8.4 mg/dL — ABNORMAL LOW (ref 8.9–10.3)
Chloride: 108 mmol/L (ref 98–111)
Creatinine, Ser: 0.95 mg/dL (ref 0.61–1.24)
GFR calc Af Amer: >60 mL/min (ref >60)
GFR calc non Af Amer: >60 mL/min (ref >60)
Glucose, Bld: 135 mg/dL — ABNORMAL HIGH (ref 70–99)
Potassium: 3.7 mmol/L (ref 3.5–5.1)
Sodium: 141 mmol/L (ref 135–145)

## 2019-07-15 LAB — CBC
HCT: 39.4 % (ref 39.0–52.0)
Hemoglobin: 12.5 g/dL — ABNORMAL LOW (ref 13.0–17.0)
MCH: 27.5 pg (ref 26.0–34.0)
MCHC: 31.7 g/dL (ref 30.0–36.0)
MCV: 86.6 fL (ref 80.0–100.0)
Platelets: 229 10*3/uL (ref 150–400)
RBC: 4.55 MIL/uL (ref 4.22–5.81)
RDW: 13.4 % (ref 11.5–15.5)
WBC: 8.4 10*3/uL (ref 4.0–10.5)
nRBC: 0 % (ref 0.0–0.2)

## 2019-07-15 LAB — GLUCOSE, CAPILLARY: Glucose-Capillary: 101 mg/dL — ABNORMAL HIGH (ref 70–99)

## 2019-07-15 MED ORDER — ACETAMINOPHEN 10 MG/ML IV SOLN
1000.0000 mg | Freq: Four times a day (QID) | INTRAVENOUS | Status: AC
  Administered 2019-07-15 – 2019-07-16 (×4): 1000 mg via INTRAVENOUS
  Filled 2019-07-15 (×4): qty 100
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
## 2019-07-15 NOTE — Progress Notes (Signed)
3 Days Post-Op   Subjective/Chief Complaint: Mild abdominal pain today. Denies n/v; does have hiccups. Ambulating some. No gas/flatus from colostomy yet   Objective: Vital signs in last 24 hours: Temp:  [97.8 F (36.6 C)-100.5 F (38.1 C)] 97.8 F (36.6 C) (08/29 0524) Pulse Rate:  [84-103] 98 (08/29 0523) Resp:  [15-22] 22 (08/29 0843) BP: (155-165)/(97-99) 162/99 (08/29 0523) SpO2:  [94 %-98 %] 98 % (08/29 0843) Last BM Date: 07/10/19  Intake/Output from previous day: 08/28 0701 - 08/29 0700 In: 2451.6 [P.O.:180; I.V.:2204.6; IV Piggyback:67] Out: 2165 [Urine:1750; Emesis/NG output:350; Drains:65] Intake/Output this shift: No intake/output data recorded.  General appearance: alert and cooperative Resp: clear to auscultation bilaterally Cardio: RRR GI: soft, appropriately tender. incision looks c/d/i without erythema. ostomy pink; no stool in appliance. JP drain serosang  Lab Results:  Recent Labs    07/14/19 0804 07/15/19 0316  WBC 8.7 8.4  HGB 12.7* 12.5*  HCT 40.3 39.4  PLT 218 229   BMET Recent Labs    07/14/19 0804 07/15/19 0316  NA 142 141  K 3.9 3.7  CL 111 108  CO2 26 22  GLUCOSE 112* 135*  BUN 16 11  CREATININE 1.14 0.95  CALCIUM 8.5* 8.4*   PT/INR No results for input(s): LABPROT, INR in the last 72 hours. ABG No results for input(s): PHART, HCO3 in the last 72 hours.  Invalid input(s): PCO2, PO2  Studies/Results: No results found.  Anti-infectives: Anti-infectives (From admission, onward)   Start     Dose/Rate Route Frequency Ordered Stop   07/12/19 1018  ceFAZolin (ANCEF) 2-4 GM/100ML-% IVPB    Note to Pharmacy: Charmayne Sheer   : cabinet override      07/12/19 1018 07/12/19 2229      Assessment/Plan: s/p Procedure(s): exploratory laparatomy  with sigmoid colectomy, ileocecectomy end left colostomy, partial cystectomy with repair of bladder (N/A) continue ng and bowel rest until bowel function returns.  Plain film to confirm  position of NG Ambulate Continue foley per urology PPx: SCDs, lovenox   LOS: 4 days   Kyle Hampton 07/15/2019

## 2019-07-16 ENCOUNTER — Inpatient Hospital Stay (HOSPITAL_COMMUNITY): Payer: Medicaid - Out of State

## 2019-07-16 LAB — CBC WITH DIFFERENTIAL/PLATELET
Abs Immature Granulocytes: 0.27 10*3/uL — ABNORMAL HIGH (ref 0.00–0.07)
Basophils Absolute: 0.1 10*3/uL (ref 0.0–0.1)
Basophils Relative: 1 %
Eosinophils Absolute: 0.2 10*3/uL (ref 0.0–0.5)
Eosinophils Relative: 2 %
HCT: 39 % (ref 39.0–52.0)
Hemoglobin: 12.2 g/dL — ABNORMAL LOW (ref 13.0–17.0)
Immature Granulocytes: 3 %
Lymphocytes Relative: 8 %
Lymphs Abs: 0.9 10*3/uL (ref 0.7–4.0)
MCH: 27.5 pg (ref 26.0–34.0)
MCHC: 31.3 g/dL (ref 30.0–36.0)
MCV: 88 fL (ref 80.0–100.0)
Monocytes Absolute: 1.7 10*3/uL — ABNORMAL HIGH (ref 0.1–1.0)
Monocytes Relative: 16 %
Neutro Abs: 7.8 10*3/uL — ABNORMAL HIGH (ref 1.7–7.7)
Neutrophils Relative %: 70 %
Platelets: 233 10*3/uL (ref 150–400)
RBC: 4.43 MIL/uL (ref 4.22–5.81)
RDW: 13.7 % (ref 11.5–15.5)
WBC: 10.9 10*3/uL — ABNORMAL HIGH (ref 4.0–10.5)
nRBC: 0 % (ref 0.0–0.2)

## 2019-07-16 LAB — BASIC METABOLIC PANEL
Anion gap: 9 (ref 5–15)
BUN: 13 mg/dL (ref 6–20)
CO2: 25 mmol/L (ref 22–32)
Calcium: 8.4 mg/dL — ABNORMAL LOW (ref 8.9–10.3)
Chloride: 109 mmol/L (ref 98–111)
Creatinine, Ser: 0.93 mg/dL (ref 0.61–1.24)
GFR calc Af Amer: >60 mL/min (ref >60)
GFR calc non Af Amer: >60 mL/min (ref >60)
Glucose, Bld: 112 mg/dL — ABNORMAL HIGH (ref 70–99)
Potassium: 3.7 mmol/L (ref 3.5–5.1)
Sodium: 143 mmol/L (ref 135–145)

## 2019-07-16 MED ORDER — IOHEXOL 300 MG/ML  SOLN
30.0000 mL | Freq: Once | INTRAMUSCULAR | Status: AC | PRN
  Administered 2019-07-16: 11:00:00 30 mL via ORAL

## 2019-07-16 MED ORDER — PIPERACILLIN-TAZOBACTAM 3.375 G IVPB
3.3750 g | Freq: Three times a day (TID) | INTRAVENOUS | Status: AC
  Administered 2019-07-16 – 2019-07-24 (×24): 3.375 g via INTRAVENOUS
  Filled 2019-07-16 (×25): qty 50

## 2019-07-16 MED ORDER — HYDROGEN PEROXIDE 3 % EX SOLN
CUTANEOUS | Status: AC
  Administered 2019-07-16: 15:00:00
  Filled 2019-07-16: qty 473

## 2019-07-16 MED ORDER — SODIUM CHLORIDE 0.9 % IV SOLN
25.0000 mg | Freq: Four times a day (QID) | INTRAVENOUS | Status: DC | PRN
  Administered 2019-07-16 – 2019-07-19 (×7): 25 mg via INTRAVENOUS
  Filled 2019-07-16 (×8): qty 1

## 2019-07-16 MED ORDER — IOHEXOL 300 MG/ML  SOLN
100.0000 mL | Freq: Once | INTRAMUSCULAR | Status: AC | PRN
  Administered 2019-07-16: 100 mL via INTRAVENOUS

## 2019-07-16 MED ORDER — SODIUM CHLORIDE (PF) 0.9 % IJ SOLN
INTRAMUSCULAR | Status: AC
  Administered 2019-07-16: 13:00:00 10 mL
  Filled 2019-07-16: qty 50

## 2019-07-16 NOTE — Progress Notes (Addendum)
4 Days Post-Op   Subjective/Chief Complaint: Abdominal discomfort better each day. Denies n/v with NG; does have hiccups. Ambulating some. No gas/flatus from colostomy yet   Objective: Vital signs in last 24 hours: Temp:  [98.2 F (36.8 C)-102 F (38.9 C)] 100.9 F (38.3 C) (08/30 0515) Pulse Rate:  [87-101] 99 (08/30 0515) Resp:  [14-23] 16 (08/30 0515) BP: (137-174)/(87-99) 147/87 (08/30 0515) SpO2:  [94 %-100 %] 95 % (08/30 0515) Last BM Date: 07/10/19  Intake/Output from previous day: 08/29 0701 - 08/30 0700 In: 2422.3 [I.V.:2123; IV Piggyback:299.3] Out: 2060 [Urine:1500; Emesis/NG output:500; Drains:60] Intake/Output this shift: No intake/output data recorded.  General appearance: alert and cooperative Resp: clear to auscultation bilaterally Cardio: RRR GI: soft, appropriately tender. incision looks c/d/i without erythema. ostomy pink; no stool in appliance. JP drain serosang  Lab Results:  Recent Labs    07/15/19 0316 07/16/19 0417  WBC 8.4 10.9*  HGB 12.5* 12.2*  HCT 39.4 39.0  PLT 229 233   BMET Recent Labs    07/15/19 0316 07/16/19 0417  NA 141 143  K 3.7 3.7  CL 108 109  CO2 22 25  GLUCOSE 135* 112*  BUN 11 13  CREATININE 0.95 0.93  CALCIUM 8.4* 8.4*   PT/INR No results for input(s): LABPROT, INR in the last 72 hours. ABG No results for input(s): PHART, HCO3 in the last 72 hours.  Invalid input(s): PCO2, PO2  Studies/Results: Dg Abd 1 View  Result Date: 07/15/2019 CLINICAL DATA:  Nasogastric tube placement. EXAM: ABDOMEN - 1 VIEW COMPARISON:  07/11/2019 FINDINGS: Nasogastric tube terminates in the lower esophagus. Mild right hemidiaphragm elevation. No free intraperitoneal air. Gaseous distension of small bowel loops up to 4.5 cm. Gas within normal caliber colon. Low pelvis excluded. IMPRESSION: Nasogastric tube terminating at the distal esophagus. Recommend advancement. Small bowel distension, favoring mild adynamic ileus. These results will  be called to the ordering clinician or representative by the Radiologist Assistant, and communication documented in the PACS or zVision Dashboard. Electronically Signed   By: Jeronimo GreavesKyle  Talbot M.D.   On: 07/15/2019 09:56   Dg Chest Port 1 View  Result Date: 07/15/2019 CLINICAL DATA:  Fever.  Small-bowel obstruction. EXAM: PORTABLE CHEST 1 VIEW COMPARISON:  None. FINDINGS: The enteric tube appears to extend below the left hemidiaphragm. There is no pneumothorax. No large pleural effusion. There are few airspace opacities at the lung bases bilaterally, right worse than left. There is no acute osseous abnormality. IMPRESSION: Subtle airspace opacities at the lung bases, right worse than left, which may represent atelectasis. An infiltrate is not excluded. Electronically Signed   By: Katherine Mantlehristopher  Green M.D.   On: 07/15/2019 21:51   Dg Abd Portable 1v  Result Date: 07/15/2019 CLINICAL DATA:  NG tube placement EXAM: PORTABLE ABDOMEN - 1 VIEW COMPARISON:  07/15/2019 FINDINGS: The tip of the enteric tube now projects over the expected region of the gastric body/fundus. The tip is pointed distally. Again noted are dilated loops of small bowel measuring up to approximately 5 cm in diameter. IMPRESSION: Enteric tube tip projects over the gastric body/fundus. Electronically Signed   By: Katherine Mantlehristopher  Green M.D.   On: 07/15/2019 15:41    Anti-infectives: Anti-infectives (From admission, onward)   Start     Dose/Rate Route Frequency Ordered Stop   07/12/19 1018  ceFAZolin (ANCEF) 2-4 GM/100ML-% IVPB    Note to Pharmacy: Viviano Simasobinson, Lauren   : cabinet override      07/12/19 1018 07/12/19 2229  Assessment/Plan: s/p Procedure(s): exploratory laparatomy  with sigmoid colectomy, ileocecectomy end left colostomy, partial cystectomy with repair of bladder (N/A) continue ng and bowel rest until bowel function returns.  Given fever, ileus, mild leukocytosis without source on workup, will plan CT a/p today with PO/IV  contrast Ambulate Continue foley per urology I had spoken over the phone to his primary GI MD yesterday - Dr. Belva Crome in [place]. - to update him on his recent events/surgery PPx: SCDs, lovenox    LOS: 5 days   Ileana Roup 07/16/2019

## 2019-07-16 NOTE — Progress Notes (Signed)
4 Days Post-Op Subjective: Patient reports he is hoping to get the NG tube out today.  Objective: Vital signs in last 24 hours: Temp:  [98.4 F (36.9 C)-102 F (38.9 C)] 98.4 F (36.9 C) (08/30 0905) Pulse Rate:  [82-101] 82 (08/30 0905) Resp:  [14-23] 21 (08/30 0912) BP: (137-174)/(87-99) 140/90 (08/30 0905) SpO2:  [94 %-100 %] 97 % (08/30 0912)  Intake/Output from previous day: 08/29 0701 - 08/30 0700 In: 2422.3 [I.V.:2123; IV Piggyback:299.3] Out: 2060 [Urine:1500; Emesis/NG output:500; Drains:60] Intake/Output this shift: Total I/O In: -  Out: 370 [Urine:150; Emesis/NG output:200; Drains:20]  Physical Exam:  No acute distress, alert and oriented, NG in place Foley catheter in place-urine clear  Lab Results: Recent Labs    07/14/19 0804 07/15/19 0316 07/16/19 0417  HGB 12.7* 12.5* 12.2*  HCT 40.3 39.4 39.0   BMET Recent Labs    07/15/19 0316 07/16/19 0417  NA 141 143  K 3.7 3.7  CL 108 109  CO2 22 25  GLUCOSE 135* 112*  BUN 11 13  CREATININE 0.95 0.93  CALCIUM 8.4* 8.4*   No results for input(s): LABPT, INR in the last 72 hours. No results for input(s): LABURIN in the last 72 hours. Results for orders placed or performed during the hospital encounter of 07/11/19  SARS CORONAVIRUS 2 (TAT 6-12 HRS) Nasal Swab Aptima Multi Swab     Status: None   Collection Time: 07/11/19  6:53 AM   Specimen: Aptima Multi Swab; Nasal Swab  Result Value Ref Range Status   SARS Coronavirus 2 NEGATIVE NEGATIVE Final    Comment: (NOTE) SARS-CoV-2 target nucleic acids are NOT DETECTED. The SARS-CoV-2 RNA is generally detectable in upper and lower respiratory specimens during the acute phase of infection. Negative results do not preclude SARS-CoV-2 infection, do not rule out co-infections with other pathogens, and should not be used as the sole basis for treatment or other patient management decisions. Negative results must be combined with clinical observations, patient  history, and epidemiological information. The expected result is Negative. Fact Sheet for Patients: SugarRoll.be Fact Sheet for Healthcare Providers: https://www.woods-mathews.com/ This test is not yet approved or cleared by the [place] FDA and  has been authorized for detection and/or diagnosis of SARS-CoV-2 by FDA under an Emergency Use Authorization (EUA). This EUA will remain  in effect (meaning this test can be used) for the duration of the COVID-19 declaration under Section 56 4(b)(1) of the Act, 21 U.S.C. section 360bbb-3(b)(1), unless the authorization is terminated or revoked sooner. Performed at [facility], [place] [address], [place], [place] [address]   MRSA PCR Screening     Status: None   Collection Time: 07/12/19  9:31 AM   Specimen: Nasal Mucosa; Nasopharyngeal  Result Value Ref Range Status   MRSA by PCR NEGATIVE NEGATIVE Final    Comment:        The GeneXpert MRSA Assay (FDA approved for NASAL specimens only), is one component of a comprehensive MRSA colonization surveillance program. It is not intended to diagnose MRSA infection nor to guide or monitor treatment for MRSA infections. Performed at [facility], [place] [address], [place], [place] [address]     Studies/Results: Dg Abd 1 View  Result Date: 07/15/2019 CLINICAL DATA:  Nasogastric tube placement. EXAM: ABDOMEN - 1 VIEW COMPARISON:  07/11/2019 FINDINGS: Nasogastric tube terminates in the lower esophagus. Mild right hemidiaphragm elevation. No free intraperitoneal air. Gaseous distension of small bowel loops up to 4.5 cm. Gas within  normal caliber colon. Low pelvis excluded. IMPRESSION: Nasogastric tube terminating at the distal esophagus. Recommend advancement. Small bowel distension, favoring mild adynamic ileus. These results will be called to the ordering clinician or representative by the Radiologist Assistant, and communication  documented in the PACS or zVision Dashboard. Electronically Signed   By: Jeronimo Greaves M.D.   On: 07/15/2019 09:56   Dg Chest Port 1 View  Result Date: 07/15/2019 CLINICAL DATA:  Fever.  Small-bowel obstruction. EXAM: PORTABLE CHEST 1 VIEW COMPARISON:  None. FINDINGS: The enteric tube appears to extend below the left hemidiaphragm. There is no pneumothorax. No large pleural effusion. There are few airspace opacities at the lung bases bilaterally, right worse than left. There is no acute osseous abnormality. IMPRESSION: Subtle airspace opacities at the lung bases, right worse than left, which may represent atelectasis. An infiltrate is not excluded. Electronically Signed   By: Katherine Mantle M.D.   On: 07/15/2019 21:51   Dg Abd Portable 1v  Result Date: 07/15/2019 CLINICAL DATA:  NG tube placement EXAM: PORTABLE ABDOMEN - 1 VIEW COMPARISON:  07/15/2019 FINDINGS: The tip of the enteric tube now projects over the expected region of the gastric body/fundus. The tip is pointed distally. Again noted are dilated loops of small bowel measuring up to approximately 5 cm in diameter. IMPRESSION: Enteric tube tip projects over the gastric body/fundus. Electronically Signed   By: Katherine Mantle M.D.   On: 07/15/2019 15:41    Assessment/Plan: s/p Procedure(s): exploratory laparatomy  with sigmoid colectomy, ileocecectomy end left colostomy, partial cystectomy with repair of bladder  Ileus, NG tube and fever noted.  Discussed with Dr. Cliffton Asters.  CT scan pending today.  He has had good urine output and creatinine normal.  We will leave Foley catheter for now.  I rounded on the patient yesterday as well and discussed again we will leave Foley to keep bladder decompressed until he is more stable and able to freely ambulate.   LOS: 5 days   Jerilee Field 07/16/2019, 10:51 AM

## 2019-07-16 NOTE — Progress Notes (Signed)
Patient dressing leaking brown colored foul smelling liquid . Honeycomb dressing removed dry gauze and ABD pad applied Dr. Lucia Gaskins notified will continue to monitor.

## 2019-07-16 NOTE — Progress Notes (Addendum)
Called by nurse about drainage from abdominal wound.  Seen by Dr. Dema Severin this AM.  CT scan repeated today.  1. CT findings are most compatible with postoperative adynamic ileus with diffuse small bowel dilatation to the level of the widely patent intact ileocolic anastomosis in the right abdomen. 2. No focal measurable/drainable abscess. Scattered gas and small amount of ill-defined fluid between the rectus muscles in the infraumbilical midline ventral abdominal wall. Scattered postsurgical gas in the pelvis anterior to the bladder and in the subcutaneous ventral abdominal wall, favor expected postsurgical change.  WBC - 10,900 - 07/16/2019  PE:   Abdomen:  He is tender in the midline, but he has no lateral abdominal tenderness (particularly over his ileo-colonic anastomosis) and I can hear some bowel sounds.  Has foul smelling wound infection, edematous colostomy, JP with 20 cc recorded so far today  A&P: 1.  Wound infection - I opened the wound and cleaned it up  Will start TID dressing changes and antibiotics  2.  Nutrition - I expect a longer post op course of limited PO intake  Will check prealbumin in AM and consider TPN  Alphonsa Overall, MD, [facility] Pager: [phone] Office phone:  [phone]

## 2019-07-17 ENCOUNTER — Inpatient Hospital Stay: Payer: Self-pay

## 2019-07-17 LAB — CBC WITH DIFFERENTIAL/PLATELET
Abs Immature Granulocytes: 0.46 10*3/uL — ABNORMAL HIGH (ref 0.00–0.07)
Basophils Absolute: 0 10*3/uL (ref 0.0–0.1)
Basophils Relative: 0 %
Eosinophils Absolute: 0.3 10*3/uL (ref 0.0–0.5)
Eosinophils Relative: 2 %
HCT: 36.7 % — ABNORMAL LOW (ref 39.0–52.0)
Hemoglobin: 11.8 g/dL — ABNORMAL LOW (ref 13.0–17.0)
Immature Granulocytes: 4 %
Lymphocytes Relative: 6 %
Lymphs Abs: 0.8 10*3/uL (ref 0.7–4.0)
MCH: 28 pg (ref 26.0–34.0)
MCHC: 32.2 g/dL (ref 30.0–36.0)
MCV: 87.2 fL (ref 80.0–100.0)
Monocytes Absolute: 1.4 10*3/uL — ABNORMAL HIGH (ref 0.1–1.0)
Monocytes Relative: 11 %
Neutro Abs: 9.3 10*3/uL — ABNORMAL HIGH (ref 1.7–7.7)
Neutrophils Relative %: 77 %
Platelets: 238 10*3/uL (ref 150–400)
RBC: 4.21 MIL/uL — ABNORMAL LOW (ref 4.22–5.81)
RDW: 13.9 % (ref 11.5–15.5)
WBC: 12.2 10*3/uL — ABNORMAL HIGH (ref 4.0–10.5)
nRBC: 0 % (ref 0.0–0.2)

## 2019-07-17 LAB — COMPREHENSIVE METABOLIC PANEL
ALT: 16 U/L (ref 0–44)
AST: 19 U/L (ref 15–41)
Albumin: 2.1 g/dL — ABNORMAL LOW (ref 3.5–5.0)
Alkaline Phosphatase: 106 U/L (ref 38–126)
Anion gap: 9 (ref 5–15)
BUN: 11 mg/dL (ref 6–20)
CO2: 23 mmol/L (ref 22–32)
Calcium: 8.4 mg/dL — ABNORMAL LOW (ref 8.9–10.3)
Chloride: 107 mmol/L (ref 98–111)
Creatinine, Ser: 0.86 mg/dL (ref 0.61–1.24)
GFR calc Af Amer: >60 mL/min (ref >60)
GFR calc non Af Amer: >60 mL/min (ref >60)
Glucose, Bld: 105 mg/dL — ABNORMAL HIGH (ref 70–99)
Potassium: 3.5 mmol/L (ref 3.5–5.1)
Sodium: 139 mmol/L (ref 135–145)
Total Bilirubin: 1.2 mg/dL (ref 0.3–1.2)
Total Protein: 6.2 g/dL — ABNORMAL LOW (ref 6.5–8.1)

## 2019-07-17 LAB — BLOOD CULTURE ID PANEL (REFLEXED)

## 2019-07-17 LAB — PHOSPHORUS: Phosphorus: 2.5 mg/dL (ref 2.5–4.6)

## 2019-07-17 LAB — MAGNESIUM: Magnesium: 2.1 mg/dL (ref 1.7–2.4)

## 2019-07-17 LAB — PREALBUMIN: Prealbumin: 5.4 mg/dL — ABNORMAL LOW (ref 18–38)

## 2019-07-17 MED ORDER — ACETAMINOPHEN 10 MG/ML IV SOLN
1000.0000 mg | Freq: Four times a day (QID) | INTRAVENOUS | Status: AC | PRN
  Administered 2019-07-17: 1000 mg via INTRAVENOUS
  Filled 2019-07-17: qty 100

## 2019-07-17 MED ORDER — TRAVASOL 10 % IV SOLN
INTRAVENOUS | Status: DC
  Filled 2019-07-17: qty 384

## 2019-07-17 NOTE — Progress Notes (Signed)
Dr Baxter Flattery called PICC team to instruct PICC hold off for today.

## 2019-07-17 NOTE — Progress Notes (Addendum)
ID PROGRESS NOTE  Patient had recent blood cx ( 1 of 4 + for MRSE) which I think maybe a contaminant. He does however had fever and leukocytosis that maybe due to wound infection.   Recommendations/addendum:  Patient having low grade fever per picc line team. Will wait another 24-48hr for picc line. Will repeat blood cx today  Wait for picc line place for nutrition til tomorrow or the day after
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
## 2019-07-17 NOTE — Progress Notes (Signed)
PHARMACY - PHYSICIAN COMMUNICATION CRITICAL VALUE ALERT - BLOOD CULTURE IDENTIFICATION (BCID)  Kyle Hampton is an 48 y.o. male who presented to [facility] on 07/11/2019 with a chief complaint of abdominal mass. Underwent exp lap on 8/26: sigmoid colectomy, ileocecectomy, L colostomy, partial cystectomy w/bladder repair. Current ileus, no drainable abscess,   Assessment:  1 of 4 bottles positive for Staph species (Coag neg Staph) with Methicillin resistance (MRSE)  Name of physician (or Provider) Contacted: Dr Dema Severin  Current antibiotics: Zosyn 3.375gm q8  Changes to prescribed antibiotics recommended: none yet, CCS MD to contact ID, planning to place PICC line, desire TPN   Results for orders placed or performed during the hospital encounter of 07/11/19  Blood Culture ID Panel (Reflexed) (Collected: 07/15/2019 10:09 PM)  Result Value Ref Range   Enterococcus species NOT DETECTED NOT DETECTED   Listeria monocytogenes NOT DETECTED NOT DETECTED   Staphylococcus species DETECTED (A) NOT DETECTED   Staphylococcus aureus (BCID) NOT DETECTED NOT DETECTED   Methicillin resistance DETECTED (A) NOT DETECTED   Streptococcus species NOT DETECTED NOT DETECTED   Streptococcus agalactiae NOT DETECTED NOT DETECTED   Streptococcus pneumoniae NOT DETECTED NOT DETECTED   Streptococcus pyogenes NOT DETECTED NOT DETECTED   Acinetobacter baumannii NOT DETECTED NOT DETECTED   Enterobacteriaceae species NOT DETECTED NOT DETECTED   Enterobacter cloacae complex NOT DETECTED NOT DETECTED   Escherichia coli NOT DETECTED NOT DETECTED   Klebsiella oxytoca NOT DETECTED NOT DETECTED   Klebsiella pneumoniae NOT DETECTED NOT DETECTED   Proteus species NOT DETECTED NOT DETECTED   Serratia marcescens NOT DETECTED NOT DETECTED   Haemophilus influenzae NOT DETECTED NOT DETECTED   Neisseria meningitidis NOT DETECTED NOT DETECTED   Pseudomonas aeruginosa NOT DETECTED NOT DETECTED   Candida albicans NOT DETECTED NOT  DETECTED   Candida glabrata NOT DETECTED NOT DETECTED   Candida krusei NOT DETECTED NOT DETECTED   Candida parapsilosis NOT DETECTED NOT DETECTED   Candida tropicalis NOT DETECTED NOT DETECTED    Nyoka Cowden, Jayli Fogleman L 07/17/2019  7:49 AM

## 2019-07-17 NOTE — Progress Notes (Addendum)
5 Days Post-Op   Subjective/Chief Complaint: Denies n/v with NG; hiccups better. Ambulating some. Some gas from colostomy now, scant liquid stool. Wound opened yesterday for wound infection - no significant drainage since this was opened - does not appear to have succus coming from wound.   Objective: Vital signs in last 24 hours: Temp:  [98.4 F (36.9 C)-99.5 F (37.5 C)] 99.5 F (37.5 C) (08/31 0515) Pulse Rate:  [75-101] 89 (08/31 0515) Resp:  [12-22] 16 (08/31 0515) BP: (137-149)/(87-99) 145/87 (08/31 0515) SpO2:  [94 %-97 %] 97 % (08/31 0515) Last BM Date: 07/15/19  Intake/Output from previous day: 08/30 0701 - 08/31 0700 In: 2401.9 [I.V.:2151.7; IV Piggyback:250.2] Out: 2405 [Urine:1450; Emesis/NG output:900; Drains:55] Intake/Output this shift: No intake/output data recorded.  General appearance: alert and cooperative Resp: clear to auscultation bilaterally Cardio: RRR GI: soft, appropriately only around incision; wound is now open, now significant drainage of succus - dressing with serous drainage/some purulence; ostomy pink; no stool in appliance. JP drain serosanguinous  Lab Results:  Recent Labs    07/16/19 0417 07/17/19 0355  WBC 10.9* 12.2*  HGB 12.2* 11.8*  HCT 39.0 36.7*  PLT 233 238   BMET Recent Labs    07/16/19 0417 07/17/19 0355  NA 143 139  K 3.7 3.5  CL 109 107  CO2 25 23  GLUCOSE 112* 105*  BUN 13 11  CREATININE 0.93 0.86  CALCIUM 8.4* 8.4*   PT/INR No results for input(s): LABPROT, INR in the last 72 hours. ABG No results for input(s): PHART, HCO3 in the last 72 hours.  Invalid input(s): PCO2, PO2  Studies/Results: Dg Abd 1 View  Result Date: 07/15/2019 CLINICAL DATA:  Nasogastric tube placement. EXAM: ABDOMEN - 1 VIEW COMPARISON:  07/11/2019 FINDINGS: Nasogastric tube terminates in the lower esophagus. Mild right hemidiaphragm elevation. No free intraperitoneal air. Gaseous distension of small bowel loops up to 4.5 cm. Gas within  normal caliber colon. Low pelvis excluded. IMPRESSION: Nasogastric tube terminating at the distal esophagus. Recommend advancement. Small bowel distension, favoring mild adynamic ileus. These results will be called to the ordering clinician or representative by the Radiologist Assistant, and communication documented in the PACS or zVision Dashboard. Electronically Signed   By: Jeronimo GreavesKyle  Talbot M.D.   On: 07/15/2019 09:56   Ct Abdomen Pelvis W Contrast  Result Date: 07/16/2019 CLINICAL DATA:  Inpatient. Crohn disease. Status post exploratory laparotomy with ileocecal resection, sigmoid resection and left-sided end colostomy 4 days prior. Patient now with low-grade fever and abdominal pain. EXAM: CT ABDOMEN AND PELVIS WITH CONTRAST TECHNIQUE: Multidetector CT imaging of the abdomen and pelvis was performed using the standard protocol following bolus administration of intravenous contrast. CONTRAST:  100mL OMNIPAQUE IOHEXOL 300 MG/ML SOLN, 30mL OMNIPAQUE IOHEXOL 300 MG/ML SOLN COMPARISON:  07/15/2019 abdominal radiograph. 07/11/2019 CT abdomen/pelvis. FINDINGS: Lower chest: Hypoventilatory changes at the dependent lung bases. Enteric tube terminates in the proximal stomach. Oral contrast is present within lower thoracic esophagus. Hepatobiliary: Normal liver size. No liver mass. Normal gallbladder with no radiopaque cholelithiasis. No biliary ductal dilatation. Pancreas: Normal, with no mass or duct dilation. Spleen: Normal size. No mass. Adrenals/Urinary Tract: Normal adrenals. Normal kidneys with no hydronephrosis and no renal mass. Bladder decompressed by indwelling Foley catheter. Expected gas in the nondependent bladder from instrumentation. Stomach/Bowel: Stomach nondistended and unremarkable. Status post ileocecal resection with ileocolic anastomosis in the right abdomen, which appears widely patent and intact. There is prominent diffuse small bowel dilatation to the level of the ileocolic anastomosis  without  focal small bowel caliber transition, most compatible with adynamic ileus. There is mild wall thickening throughout several mid to distal small bowel loops without appreciable pneumatosis. Status post sigmoid resection with end colostomy in the ventral left abdominal wall. No large bowel wall thickening, pneumatosis or significant diverticular disease. Hartmann's pouch is relatively collapsed and unremarkable. Vascular/Lymphatic: Normal caliber abdominal aorta. Patent portal, splenic, hepatic and renal veins. No pathologically enlarged lymph nodes in the abdomen or pelvis. Reproductive: Normal size prostate. Other: No focal measurable fluid collection. Small amount of ill-defined fluid and gas in midline infraumbilical ventral abdominal wall between the rectus muscles (series 2/image 71). Scattered gas in the extraperitoneal anterior pelvis. Minimal scattered gas in the anterior peritoneal cavity near the umbilicus. Subcutaneous fat stranding and mild subcutaneous emphysema in the ventral lower abdominal wall. Midline laparotomy skin staples. Right lower quadrant surgical drain terminates in left pelvis. Musculoskeletal: No aggressive appearing focal osseous lesions. Moderate degenerative disc disease at L5-S1. IMPRESSION: 1. CT findings are most compatible with postoperative adynamic ileus with diffuse small bowel dilatation to the level of the widely patent intact ileocolic anastomosis in the right abdomen. 2. No focal measurable/drainable abscess. Scattered gas and small amount of ill-defined fluid between the rectus muscles in the infraumbilical midline ventral abdominal wall. Scattered postsurgical gas in the pelvis anterior to the bladder and in the subcutaneous ventral abdominal wall, favor expected postsurgical change. Electronically Signed   By: Ilona Sorrel M.D.   On: 07/16/2019 11:42   Dg Chest Port 1 View  Result Date: 07/15/2019 CLINICAL DATA:  Fever.  Small-bowel obstruction. EXAM: PORTABLE CHEST 1  VIEW COMPARISON:  None. FINDINGS: The enteric tube appears to extend below the left hemidiaphragm. There is no pneumothorax. No large pleural effusion. There are few airspace opacities at the lung bases bilaterally, right worse than left. There is no acute osseous abnormality. IMPRESSION: Subtle airspace opacities at the lung bases, right worse than left, which may represent atelectasis. An infiltrate is not excluded. Electronically Signed   By: Constance Holster M.D.   On: 07/15/2019 21:51   Dg Abd Portable 1v  Result Date: 07/15/2019 CLINICAL DATA:  NG tube placement EXAM: PORTABLE ABDOMEN - 1 VIEW COMPARISON:  07/15/2019 FINDINGS: The tip of the enteric tube now projects over the expected region of the gastric body/fundus. The tip is pointed distally. Again noted are dilated loops of small bowel measuring up to approximately 5 cm in diameter. IMPRESSION: Enteric tube tip projects over the gastric body/fundus. Electronically Signed   By: Constance Holster M.D.   On: 07/15/2019 15:41    Anti-infectives: Anti-infectives (From admission, onward)   Start     Dose/Rate Route Frequency Ordered Stop   07/16/19 1530  piperacillin-tazobactam (ZOSYN) IVPB 3.375 g     3.375 g 12.5 mL/hr over 240 Minutes Intravenous Every 8 hours 07/16/19 1502     07/12/19 1018  ceFAZolin (ANCEF) 2-4 GM/100ML-% IVPB    Note to Pharmacy: Charmayne Sheer   : cabinet override      07/12/19 1018 07/12/19 2229      Assessment/Plan: s/p Procedure(s): exploratory laparatomy  with sigmoid colectomy, ileocecectomy end left colostomy, partial cystectomy with repair of bladder (N/A) continue ng and bowel rest until bowel function returns.  Prealbumin 5; will proceed with PICC/TPN once cleared by ID CT scan demonstrated no evidence of leak per se; does have wound infection - wound now opened; blood cultures positive for MRSA; continue IV abx and TID dressing changes with wet  to dry Ambulate Continue foley per  urology PT/OT PPx: SCDs, lovenox    LOS: 6 days   Andria Meuse 07/17/2019

## 2019-07-17 NOTE — Progress Notes (Signed)
[facility] NOTE   Pharmacy Consult for TPN Indication:    HPI:   48 y.o. male who presented to [facility] on 07/11/2019 with a chief complaint of abdominal mass. Underwent exp lap on 8/26: sigmoid colectomy, ileocecectomy, L colostomy, partial cystectomy w/bladder repair. Current ileus, no drainable abscess,   TPN Access: plan PICC, hold off on placement for 24-48 hr from 8/31 and repeat BCx (per ID) TPN start date:   Patient Measurements: Estimated body mass index is 27.09 kg/m as calculated from the following:   Height as of this encounter: 5\' 11"  (1.803 m).   Weight as of this encounter: 194 lb 3.6 oz (88.1 kg).  Intake/Output:   Estimated Nutritional Needs - Per RD recommendations  Dietitian consult ordered kCal: Protein:  Fluid:  Current Nutrition:  IVF: D5NS with 20 mEq KCl/L at 100 ml/hr  Insulin Requirements:    Recent Labs    07/16/19 0417 07/17/19 0355  NA 143 139  K 3.7 3.5  CL 109 107  CO2 25 23  GLUCOSE 112* 105*  BUN 13 11  CREATININE 0.93 0.86  CALCIUM 8.4* 8.4*  PHOS  --  2.5  MG  --  2.1  ALBUMIN  --  2.1*  ALKPHOS  --  106  AST  --  19  ALT  --  16  BILITOT  --  1.2  PREALBUMIN  --  5.4*     ASSESSMENT                                                                                                           Glucose -   Electrolytes -   Renal -   LFTs -    TGs -   Prealbumin -    PLAN                                                                                                     At 1800 today:  Holding on PICC placement per ID  TPN to contain standard multivitamins and trace elements.  Plan to advance TPN as tolerated to the goal rate once started TPN lab panels on Mondays & Thursdays once started F/u daily.   Minda Ditto    07/17/2019 12:36 PM

## 2019-07-17 NOTE — Consult Note (Addendum)
[facility] Nurse ostomy consult note Surgical team is following for assessment and plan of care for abd wound.  Stoma type/location: Left sided colostomy. Stomal assessment/size: 2 inches, edematous, red, moist, raised with os at center Peristomal assessment: intact skin surrounding Treatment options for stomal/peristomal skin: skin barrier ring Output: small amt brown liquid stool Ostomy pouching: 2pc. 2 and 3/4 inch pouching system with skin barrier ring. Education provided: Demonstrated pouch change using barrier ring to assist with maintaining a seal, and 2 piece pouching system.  Pt assisted with pouch application and was able to open and close to empty.  Discussed pouching routines and ordering supplies.  Extra supplies left in the room.  [facility] team will perform another educational session on Wed.  Enrolled patient in [place] program: Yes Julien Girt MSN, RN, [place], [place], [place]
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
## 2019-07-17 NOTE — Progress Notes (Signed)
PICC line cleared by Dr Baxter Flattery. IV team was notified.

## 2019-07-18 DIAGNOSIS — R066 Hiccough: Secondary | ICD-10-CM

## 2019-07-18 DIAGNOSIS — T8149XA Infection following a procedure, other surgical site, initial encounter: Secondary | ICD-10-CM | POA: Diagnosis not present

## 2019-07-18 DIAGNOSIS — Z886 Allergy status to analgesic agent status: Secondary | ICD-10-CM

## 2019-07-18 DIAGNOSIS — K5090 Crohn's disease, unspecified, without complications: Secondary | ICD-10-CM

## 2019-07-18 DIAGNOSIS — Z95828 Presence of other vascular implants and grafts: Secondary | ICD-10-CM

## 2019-07-18 DIAGNOSIS — T8140XA Infection following a procedure, unspecified, initial encounter: Secondary | ICD-10-CM

## 2019-07-18 LAB — CBC WITH DIFFERENTIAL/PLATELET
Abs Immature Granulocytes: 0.54 10*3/uL — ABNORMAL HIGH (ref 0.00–0.07)
Basophils Absolute: 0.1 10*3/uL (ref 0.0–0.1)
Basophils Relative: 1 %
Eosinophils Absolute: 0.2 10*3/uL (ref 0.0–0.5)
Eosinophils Relative: 2 %
HCT: 35.5 % — ABNORMAL LOW (ref 39.0–52.0)
Hemoglobin: 11.4 g/dL — ABNORMAL LOW (ref 13.0–17.0)
Immature Granulocytes: 5 %
Lymphocytes Relative: 6 %
Lymphs Abs: 0.7 10*3/uL (ref 0.7–4.0)
MCH: 27.5 pg (ref 26.0–34.0)
MCHC: 32.1 g/dL (ref 30.0–36.0)
MCV: 85.5 fL (ref 80.0–100.0)
Monocytes Absolute: 1.1 10*3/uL — ABNORMAL HIGH (ref 0.1–1.0)
Monocytes Relative: 10 %
Neutro Abs: 8.5 10*3/uL — ABNORMAL HIGH (ref 1.7–7.7)
Neutrophils Relative %: 76 %
Platelets: 270 10*3/uL (ref 150–400)
RBC: 4.15 MIL/uL — ABNORMAL LOW (ref 4.22–5.81)
RDW: 13.8 % (ref 11.5–15.5)
WBC: 11.1 10*3/uL — ABNORMAL HIGH (ref 4.0–10.5)
nRBC: 0 % (ref 0.0–0.2)

## 2019-07-18 LAB — CULTURE, BLOOD (ROUTINE X 2): Special Requests: ADEQUATE

## 2019-07-18 MED ORDER — DAKINS (1/4 STRENGTH) 0.125 % EX SOLN
Freq: Three times a day (TID) | CUTANEOUS | Status: AC
  Administered 2019-07-18 – 2019-07-19 (×6)
  Administered 2019-07-20 (×2): 1
  Filled 2019-07-18 (×2): qty 473

## 2019-07-18 MED ORDER — VANCOMYCIN HCL 10 G IV SOLR
1500.0000 mg | Freq: Two times a day (BID) | INTRAVENOUS | Status: AC
  Administered 2019-07-18 – 2019-07-24 (×12): 1500 mg via INTRAVENOUS
  Filled 2019-07-18 (×12): qty 1500

## 2019-07-18 MED ORDER — ACETAMINOPHEN 10 MG/ML IV SOLN
1000.0000 mg | Freq: Once | INTRAVENOUS | Status: AC
  Administered 2019-07-19: 1000 mg via INTRAVENOUS
  Filled 2019-07-18: qty 100

## 2019-07-18 NOTE — Progress Notes (Signed)
Patient's PCP relayed an urgent message for me to contact the MD or PA treating Kyle Hampton and tell them to review Kyle Hampton full medical history this information was relayed to Dr Dema Severin.

## 2019-07-18 NOTE — Progress Notes (Signed)
Initial Nutrition Assessment  INTERVENTION:   Monitor magnesium, potassium, and phosphorus daily for at least 3 days, MD to replete as needed, as pt is at risk for refeeding syndrome given  poor PO/NPO status x 7 days now.  -TPN per Pharmacy -awaiting PICC placement -Will monitor for ability to advance diet  NUTRITION DIAGNOSIS:   Increased nutrient needs related to chronic illness, post-op healing as evidenced by estimated needs.  GOAL:   Patient will meet greater than or equal to 90% of their needs  MONITOR:   Diet advancement, Labs, Weight trends, I & O's  REASON FOR ASSESSMENT:   Consult New TPN/TNA  ASSESSMENT:   48 y.o. male with a history of HTN and Crohn's disease who presented to the ED with abdominal pain.  Patient reportedly began having severe, constant epigastric pain that radiated to the right side of his abdomen yesterday with associated nausea and greater than 5 episodes of nonbilious emesis.  8/26: s/p exploratory laparatomy  with sigmoid colectomy, ileocecectomy end left colostomy, partial cystectomy with repair of bladder   **RD working remotely**  Patient has remained NPO since admission. Pt expected to start TPN given suspected prolonged poor PO. Awaiting PICC placement, waiting for ID clearance.  PTA pt was having N/V and abdominal pain. Now NPO for 7 days.   Admission weight: 184 lbs. No weight measured since 8/26.  I/Os: +8L since admit NGT output 8/31: 350 ml  Labs reviewed. Medications: D5 and NaCl infusion  NUTRITION - FOCUSED PHYSICAL EXAM:  Unable to perform -working remotely.  Diet Order:   Diet Order            Diet NPO time specified Except for: Ice Chips  Diet effective now              EDUCATION NEEDS:   No education needs have been identified at this time  Skin:  Skin Assessment: Reviewed RN Assessment  Last BM:  8/29  Height:   Ht Readings from Last 1 Encounters:  07/12/19 5\' 11"  (1.803 m)    Weight:   Wt  Readings from Last 1 Encounters:  07/12/19 88.1 kg    Ideal Body Weight:  78.1 kg  BMI:  Body mass index is 27.09 kg/m.  Estimated Nutritional Needs:   Kcal:  2000-2200  Protein:  100-110g  Fluid:  2L/day  Clayton Bibles, MS, RD, LDN [place] Dietitian Pager: [phone] After Hours Pager: [phone]

## 2019-07-18 NOTE — Progress Notes (Signed)
Pharmacy Antibiotic Note  Kyle Hampton is a 48 y.o. male admitted on 07/11/2019 with Intra-abd infecton s/p Expl lap: sigmoid colectomy, ileocecectomy, colostomy, partial cystectomy-bladder repair.  Zosyn ordered per MD. 9/1 Pharmacy has been consulted for Vancomycin dosing for wound infection  Plan: Day 3 Zosyn 3.375gm q8 (4 hr infusion) D1 Vancomycin 1500mg  q12  Height: 5\' 11"  (180.3 cm) Weight: 194 lb 3.6 oz (88.1 kg) IBW/kg (Calculated) : 75.3  Temp (24hrs), Avg:99.4 F (37.4 C), Min:97.9 F (36.6 C), Max:101.4 F (38.6 C)  Recent Labs  Lab 07/12/19 0519 07/14/19 0804 07/15/19 0316 07/16/19 0417 07/17/19 0355 07/18/19 0351  WBC 4.1 8.7 8.4 10.9* 12.2* 11.1*  CREATININE 0.92 1.14 0.95 0.93 0.86  --     Estimated Creatinine Clearance: 113.1 mL/min (by C-G formula based on SCr of 0.86 mg/dL).    Allergies  Allergen Reactions  . Nsaids     Crohn's disease    Antimicrobials this admission: 8/30 Zosyn >> 9/1 Vancomycin >>  Dose adjustments this admission:  Microbiology results: 8/26 MRSA PCR: neg 8/29 BCx: BCID 1/4 Staph sp, Meth resistant, ID considers contaminant, BUT leukocytosis (rpt BCx, hold on PICC) 8/31 rpt BCx: ngtd  Thank you for allowing pharmacy to be a part of this patient's care.  Minda Ditto 07/18/2019 10:29 AM
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
## 2019-07-18 NOTE — Progress Notes (Signed)
6 Days Post-Op   Subjective/Chief Complaint: Denies n/v with NG; hiccups intermittently. Ambulating some. Wound opened Sunday for wound infection - still with some drainage - does not appear to be succus - is somewhat purulent in character but not bilious. JP drain serousanguinous   Objective: Vital signs in last 24 hours: Temp:  [97.9 F (36.6 C)-101.4 F (38.6 C)] 98.9 F (37.2 C) (09/01 0615) Pulse Rate:  [80-98] 83 (09/01 0615) Resp:  [14-20] 14 (09/01 0621) BP: (134-139)/(83-92) 134/87 (09/01 0615) SpO2:  [93 %-98 %] 98 % (09/01 0621) Last BM Date: 07/15/19  Intake/Output from previous day: 08/31 0701 - 09/01 0700 In: 1394.4 [I.V.:1159.9; NG/GT:60; IV Piggyback:174.6] Out: 1540 [Urine:1125; Emesis/NG output:350; Drains:15; Stool:50] Intake/Output this shift: No intake/output data recorded.  General appearance: alert and cooperative Resp: clear to auscultation bilaterally Cardio: RRR GI: soft, appropriately only around incision; wound is open, NO succus drainage (nor was there succus yesterday); scant purulent drainainge; fascia appears intact, PDS visible, no visible bowel. Dressing with some purulence; ostomy pink; no stool in appliance. JP drain serosanguinous  Lab Results:  Recent Labs    07/17/19 0355 07/18/19 0351  WBC 12.2* 11.1*  HGB 11.8* 11.4*  HCT 36.7* 35.5*  PLT 238 270   BMET Recent Labs    07/16/19 0417 07/17/19 0355  NA 143 139  K 3.7 3.5  CL 109 107  CO2 25 23  GLUCOSE 112* 105*  BUN 13 11  CREATININE 0.93 0.86  CALCIUM 8.4* 8.4*   PT/INR No results for input(s): LABPROT, INR in the last 72 hours. ABG No results for input(s): PHART, HCO3 in the last 72 hours.  Invalid input(s): PCO2, PO2  Studies/Results: Ct Abdomen Pelvis W Contrast  Result Date: 07/16/2019 CLINICAL DATA:  Inpatient. Crohn disease. Status post exploratory laparotomy with ileocecal resection, sigmoid resection and left-sided end colostomy 4 days prior. Patient now  with low-grade fever and abdominal pain. EXAM: CT ABDOMEN AND PELVIS WITH CONTRAST TECHNIQUE: Multidetector CT imaging of the abdomen and pelvis was performed using the standard protocol following bolus administration of intravenous contrast. CONTRAST:  131mL OMNIPAQUE IOHEXOL 300 MG/ML SOLN, 25mL OMNIPAQUE IOHEXOL 300 MG/ML SOLN COMPARISON:  07/15/2019 abdominal radiograph. 07/11/2019 CT abdomen/pelvis. FINDINGS: Lower chest: Hypoventilatory changes at the dependent lung bases. Enteric tube terminates in the proximal stomach. Oral contrast is present within lower thoracic esophagus. Hepatobiliary: Normal liver size. No liver mass. Normal gallbladder with no radiopaque cholelithiasis. No biliary ductal dilatation. Pancreas: Normal, with no mass or duct dilation. Spleen: Normal size. No mass. Adrenals/Urinary Tract: Normal adrenals. Normal kidneys with no hydronephrosis and no renal mass. Bladder decompressed by indwelling Foley catheter. Expected gas in the nondependent bladder from instrumentation. Stomach/Bowel: Stomach nondistended and unremarkable. Status post ileocecal resection with ileocolic anastomosis in the right abdomen, which appears widely patent and intact. There is prominent diffuse small bowel dilatation to the level of the ileocolic anastomosis without focal small bowel caliber transition, most compatible with adynamic ileus. There is mild wall thickening throughout several mid to distal small bowel loops without appreciable pneumatosis. Status post sigmoid resection with end colostomy in the ventral left abdominal wall. No large bowel wall thickening, pneumatosis or significant diverticular disease. Hartmann's pouch is relatively collapsed and unremarkable. Vascular/Lymphatic: Normal caliber abdominal aorta. Patent portal, splenic, hepatic and renal veins. No pathologically enlarged lymph nodes in the abdomen or pelvis. Reproductive: Normal size prostate. Other: No focal measurable fluid  collection. Small amount of ill-defined fluid and gas in midline infraumbilical ventral abdominal  wall between the rectus muscles (series 2/image 71). Scattered gas in the extraperitoneal anterior pelvis. Minimal scattered gas in the anterior peritoneal cavity near the umbilicus. Subcutaneous fat stranding and mild subcutaneous emphysema in the ventral lower abdominal wall. Midline laparotomy skin staples. Right lower quadrant surgical drain terminates in left pelvis. Musculoskeletal: No aggressive appearing focal osseous lesions. Moderate degenerative disc disease at L5-S1. IMPRESSION: 1. CT findings are most compatible with postoperative adynamic ileus with diffuse small bowel dilatation to the level of the widely patent intact ileocolic anastomosis in the right abdomen. 2. No focal measurable/drainable abscess. Scattered gas and small amount of ill-defined fluid between the rectus muscles in the infraumbilical midline ventral abdominal wall. Scattered postsurgical gas in the pelvis anterior to the bladder and in the subcutaneous ventral abdominal wall, favor expected postsurgical change. Electronically Signed   By: Delbert Phenix M.D.   On: 07/16/2019 11:42   [place] Ekg Site Rite  Result Date: 07/17/2019 If Site Rite image not attached, placement could not be confirmed due to current cardiac rhythm.   Anti-infectives: Anti-infectives (From admission, onward)   Start     Dose/Rate Route Frequency Ordered Stop   07/16/19 1530  piperacillin-tazobactam (ZOSYN) IVPB 3.375 g     3.375 g 12.5 mL/hr over 240 Minutes Intravenous Every 8 hours 07/16/19 1502     07/12/19 1018  ceFAZolin (ANCEF) 2-4 GM/100ML-% IVPB    Note to Pharmacy: Viviano Simas   : cabinet override      07/12/19 1018 07/12/19 2229      Assessment/Plan: s/p Procedure(s): exploratory laparatomy  with sigmoid colectomy, ileocecectomy end left colostomy, partial cystectomy with repair of bladder (N/A) continue ng and bowel rest until  bowel function returns.  -Prealbumin 5; will proceed with PICC/TPN once cleared by ID - requesting we wait until afebrile x24-48hrs -CT scan demonstrated no evidence of leak; does have wound infection - wound opened; blood cxs with 1 bottle with Staph, possible contamination, ID following; continue IV abx and TID dressing changes with wet to dry - will add dakins today. WBC down-trending - 11 from 12 -Ambulate -Continue foley per urology -PT/OT -PPx: SCDs, lovenox    LOS: 7 days   Andria Meuse 07/18/2019
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
## 2019-07-19 DIAGNOSIS — R140 Abdominal distension (gaseous): Secondary | ICD-10-CM

## 2019-07-19 DIAGNOSIS — R509 Fever, unspecified: Secondary | ICD-10-CM

## 2019-07-19 DIAGNOSIS — R109 Unspecified abdominal pain: Secondary | ICD-10-CM

## 2019-07-19 DIAGNOSIS — Z933 Colostomy status: Secondary | ICD-10-CM

## 2019-07-19 LAB — CBC WITH DIFFERENTIAL/PLATELET
Abs Immature Granulocytes: 0.13 10*3/uL — ABNORMAL HIGH (ref 0.00–0.07)
Basophils Absolute: 0 10*3/uL (ref 0.0–0.1)
Basophils Relative: 0 %
Eosinophils Absolute: 0.2 10*3/uL (ref 0.0–0.5)
Eosinophils Relative: 3 %
HCT: 34 % — ABNORMAL LOW (ref 39.0–52.0)
Hemoglobin: 11.1 g/dL — ABNORMAL LOW (ref 13.0–17.0)
Immature Granulocytes: 2 %
Lymphocytes Relative: 8 %
Lymphs Abs: 0.6 10*3/uL — ABNORMAL LOW (ref 0.7–4.0)
MCH: 27.6 pg (ref 26.0–34.0)
MCHC: 32.6 g/dL (ref 30.0–36.0)
MCV: 84.6 fL (ref 80.0–100.0)
Monocytes Absolute: 0.7 10*3/uL (ref 0.1–1.0)
Monocytes Relative: 10 %
Neutro Abs: 5.9 10*3/uL (ref 1.7–7.7)
Neutrophils Relative %: 77 %
Platelets: 306 10*3/uL (ref 150–400)
RBC: 4.02 MIL/uL — ABNORMAL LOW (ref 4.22–5.81)
RDW: 13.7 % (ref 11.5–15.5)
WBC: 7.6 10*3/uL (ref 4.0–10.5)
nRBC: 0 % (ref 0.0–0.2)

## 2019-07-19 LAB — BASIC METABOLIC PANEL
Anion gap: 12 (ref 5–15)
BUN: 9 mg/dL (ref 6–20)
CO2: 23 mmol/L (ref 22–32)
Calcium: 8.4 mg/dL — ABNORMAL LOW (ref 8.9–10.3)
Chloride: 102 mmol/L (ref 98–111)
Creatinine, Ser: 0.78 mg/dL (ref 0.61–1.24)
GFR calc Af Amer: >60 mL/min (ref >60)
GFR calc non Af Amer: >60 mL/min (ref >60)
Glucose, Bld: 110 mg/dL — ABNORMAL HIGH (ref 70–99)
Potassium: 3.5 mmol/L (ref 3.5–5.1)
Sodium: 137 mmol/L (ref 135–145)

## 2019-07-19 LAB — CREATININE, SERUM
Creatinine, Ser: 0.86 mg/dL (ref 0.61–1.24)
GFR calc Af Amer: >60 mL/min (ref >60)
GFR calc non Af Amer: >60 mL/min (ref >60)

## 2019-07-19 MED ORDER — METHOCARBAMOL 1000 MG/10ML IJ SOLN
500.0000 mg | Freq: Three times a day (TID) | INTRAVENOUS | Status: DC
  Administered 2019-07-19 – 2019-07-23 (×12): 500 mg via INTRAVENOUS
  Filled 2019-07-19: qty 500
  Filled 2019-07-19 (×2): qty 5
  Filled 2019-07-19 (×6): qty 500
  Filled 2019-07-19: qty 5
  Filled 2019-07-19: qty 500
  Filled 2019-07-19 (×4): qty 5
  Filled 2019-07-19 (×2): qty 500

## 2019-07-19 MED ORDER — DIPHENHYDRAMINE HCL 25 MG PO CAPS
25.0000 mg | ORAL_CAPSULE | Freq: Every evening | ORAL | Status: AC | PRN
  Administered 2019-07-19: 25 mg via ORAL
  Filled 2019-07-19: qty 1

## 2019-07-19 MED ORDER — ACETAMINOPHEN 500 MG PO TABS
1000.0000 mg | ORAL_TABLET | Freq: Three times a day (TID) | ORAL | Status: DC
  Administered 2019-07-19 – 2019-07-23 (×11): 1000 mg via ORAL
  Filled 2019-07-19 (×12): qty 2

## 2019-07-19 NOTE — Progress Notes (Signed)
Patient is now POD#6.  His ileus seems to be resolving.  It is okay to remove his catheter at this point and continue with I/Os.  Would check a PVR once the foley catheter is removed to ensure that he is emptying his bladder reasonably well.

## 2019-07-19 NOTE — Progress Notes (Signed)
Patient has voided after foley removal. Unable to obtain PVR due to abdominal incision.

## 2019-07-19 NOTE — Consult Note (Signed)
[place] Nurse ostomy consultnote Surgical team is following for assessment and plan of care for abd wound.   Stoma type/location:Left sided colostomy. Stomal assessment/size:2 inches, edematous, red, moist, raised with os at center Peristomal assessment:intact skin surrounding Treatment options for stomal/peristomal skin:skin barrier ring Output: small amt brown liquid stool Ostomy pouching: 2pc.2 and 3/4 inch pouching system with skin barrier ring. Education provided:Demonstrated pouch change using barrier ring to assist with maintaining a seal, and 2 piece pouching system.  Pt assisted with pouch application and was able to open and close to empty without assistance.  Reviewed pouching routines and ordering supplies.  Extra supplies ordered to the room and educational materials are at the bedside. Enrolled patient in [facility] program: Yes, previously. A WOC team member will continue to follow for further teaching sessions. Julien Girt MSN, RN, [place], [place], [place]
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
## 2019-07-19 NOTE — Progress Notes (Signed)
7 Days Post-Op   Subjective/Chief Complaint: Reports he is feeling much better today and his abdominal pain has improved a lot. hiccups intermittently. Ambulating some.    Objective: Vital signs in last 24 hours: Temp:  [98.3 F (36.8 C)-101.7 F (38.7 C)] 98.7 F (37.1 C) (09/02 0701) Pulse Rate:  [67-89] 74 (09/02 0701) Resp:  [14-20] 15 (09/02 0750) BP: (140-162)/(85-104) 150/95 (09/02 0701) SpO2:  [94 %-100 %] 96 % (09/02 0750) Last BM Date: 07/15/19  Intake/Output from previous day: 09/01 0701 - 09/02 0700 In: 2761.7 [P.O.:60; I.V.:1250; IV Piggyback:1451.8] Out: 2212 [Urine:1400; Emesis/NG output:800; Drains:12] Intake/Output this shift: No intake/output data recorded.  General appearance: alert and cooperative Resp: clear to auscultation bilaterally Cardio: RRR GI: soft, minimally only around incision; colostomy pink with gas and stool in appliance; wound is open, no succus drainage - appears purulent and is foul smelling; fascia appears intact, PDS visible, no visible bowel. Dressing with some purulence; ostomy pink; no stool in appliance. JP drain serosanguinous  Lab Results:  Recent Labs    07/18/19 0351 07/19/19 0344  WBC 11.1* 7.6  HGB 11.4* 11.1*  HCT 35.5* 34.0*  PLT 270 306   BMET Recent Labs    07/17/19 0355 07/19/19 0344  NA 139 137  K 3.5 3.5  CL 107 102  CO2 23 23  GLUCOSE 105* 110*  BUN 11 9  CREATININE 0.86 0.78  0.86  CALCIUM 8.4* 8.4*   PT/INR No results for input(s): LABPROT, INR in the last 72 hours. ABG No results for input(s): PHART, HCO3 in the last 72 hours.  Invalid input(s): PCO2, PO2  Studies/Results: [place] Ekg Site Rite  Result Date: 07/17/2019 If Site Rite image not attached, placement could not be confirmed due to current cardiac rhythm.   Anti-infectives: Anti-infectives (From admission, onward)   Start     Dose/Rate Route Frequency Ordered Stop   07/18/19 1100  vancomycin (VANCOCIN) 1,500 mg in sodium chloride  0.9 % 500 mL IVPB     1,500 mg 250 mL/hr over 120 Minutes Intravenous Every 12 hours 07/18/19 1041     07/16/19 1530  piperacillin-tazobactam (ZOSYN) IVPB 3.375 g     3.375 g 12.5 mL/hr over 240 Minutes Intravenous Every 8 hours 07/16/19 1502     07/12/19 1018  ceFAZolin (ANCEF) 2-4 GM/100ML-% IVPB    Note to Pharmacy: Charmayne Sheer   : cabinet override      07/12/19 1018 07/12/19 2229      Assessment/Plan: s/p Procedure(s): exploratory laparatomy  with sigmoid colectomy, ileocecectomy end left colostomy, partial cystectomy with repair of bladder (N/A) continue ng and bowel rest until bowel function returns.  -Prealbumin 5; will proceed with PICC/TPN once cleared by ID - requesting we wait until afebrile x24-48hrs -CT scan demonstrated no evidence of leak; does have wound infection - wound opened; blood cxs with 1 bottle with Staph, possible contamination, ID following; continue IV abx and TID dressing changes with wet to dry - will add dakins today. WBC has now normalized; ileus is resolving - now having gas and stool from stoma -Ambulate -Continue foley per urology -PT/OT -PPx: SCDs, lovenox    LOS: 8 days   Ileana Roup 07/19/2019

## 2019-07-20 DIAGNOSIS — R61 Generalized hyperhidrosis: Secondary | ICD-10-CM

## 2019-07-20 DIAGNOSIS — T8149XA Infection following a procedure, other surgical site, initial encounter: Secondary | ICD-10-CM

## 2019-07-20 LAB — CBC WITH DIFFERENTIAL/PLATELET
Abs Immature Granulocytes: 0.16 10*3/uL — ABNORMAL HIGH (ref 0.00–0.07)
Basophils Absolute: 0.1 10*3/uL (ref 0.0–0.1)
Basophils Relative: 1 %
Eosinophils Absolute: 0.2 10*3/uL (ref 0.0–0.5)
Eosinophils Relative: 2 %
HCT: 37 % — ABNORMAL LOW (ref 39.0–52.0)
Hemoglobin: 11.9 g/dL — ABNORMAL LOW (ref 13.0–17.0)
Immature Granulocytes: 2 %
Lymphocytes Relative: 10 %
Lymphs Abs: 0.9 10*3/uL (ref 0.7–4.0)
MCH: 27.4 pg (ref 26.0–34.0)
MCHC: 32.2 g/dL (ref 30.0–36.0)
MCV: 85.3 fL (ref 80.0–100.0)
Monocytes Absolute: 0.9 10*3/uL (ref 0.1–1.0)
Monocytes Relative: 10 %
Neutro Abs: 6.7 10*3/uL (ref 1.7–7.7)
Neutrophils Relative %: 75 %
Platelets: 365 10*3/uL (ref 150–400)
RBC: 4.34 MIL/uL (ref 4.22–5.81)
RDW: 13.8 % (ref 11.5–15.5)
WBC: 8.8 10*3/uL (ref 4.0–10.5)
nRBC: 0 % (ref 0.0–0.2)

## 2019-07-20 LAB — BASIC METABOLIC PANEL
Anion gap: 8 (ref 5–15)
BUN: 5 mg/dL — ABNORMAL LOW (ref 6–20)
CO2: 24 mmol/L (ref 22–32)
Calcium: 8.3 mg/dL — ABNORMAL LOW (ref 8.9–10.3)
Chloride: 104 mmol/L (ref 98–111)
Creatinine, Ser: 0.79 mg/dL (ref 0.61–1.24)
GFR calc Af Amer: >60 mL/min (ref >60)
GFR calc non Af Amer: >60 mL/min (ref >60)
Glucose, Bld: 124 mg/dL — ABNORMAL HIGH (ref 70–99)
Potassium: 3.8 mmol/L (ref 3.5–5.1)
Sodium: 136 mmol/L (ref 135–145)

## 2019-07-20 MED ORDER — JUVEN PO PACK
1.0000 | PACK | Freq: Two times a day (BID) | ORAL | Status: DC
  Administered 2019-07-20 – 2019-07-26 (×9): 1 via ORAL
  Filled 2019-07-20 (×13): qty 1

## 2019-07-20 MED ORDER — BOOST / RESOURCE BREEZE PO LIQD CUSTOM
1.0000 | Freq: Three times a day (TID) | ORAL | Status: DC
  Administered 2019-07-20: 1 via ORAL

## 2019-07-20 MED ORDER — HYDROMORPHONE HCL 1 MG/ML IJ SOLN
1.0000 mg | INTRAMUSCULAR | Status: DC | PRN
  Administered 2019-07-20 – 2019-07-24 (×12): 1 mg via INTRAVENOUS
  Filled 2019-07-20 (×13): qty 1

## 2019-07-20 MED ORDER — OXYCODONE HCL 5 MG PO TABS
5.0000 mg | ORAL_TABLET | ORAL | Status: DC | PRN
  Administered 2019-07-20: 5 mg via ORAL
  Administered 2019-07-20 – 2019-07-23 (×6): 10 mg via ORAL
  Filled 2019-07-20: qty 2
  Filled 2019-07-20: qty 1
  Filled 2019-07-20 (×5): qty 2

## 2019-07-20 MED ORDER — SODIUM CHLORIDE 0.9 % IV SOLN
12.5000 mg | Freq: Four times a day (QID) | INTRAVENOUS | Status: DC | PRN
  Administered 2019-07-24 – 2019-07-25 (×2): 12.5 mg via INTRAVENOUS
  Filled 2019-07-20 (×3): qty 0.5
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
## 2019-07-20 NOTE — Progress Notes (Signed)
Pharmacy Antibiotic Note  Kyle Hampton is a 48 y.o. male with hx Crohn's disease and intra-abdominal mass with SBO - s/p sigmoid colectomy, ileocecectomy end left colostomy, and partial cystectomy with repair of bladder on 8/26.  Patient's currently on vancomycin and zosyn for fever and post-op wound infection.  Today, 07/20/2019: - D5 zosyn and D3 vancomycin - Tmax 99.7, wbc wnl - scr stable - repeat bcx remains negative - ID's note on 9/2 recom to continue with current abx for now  Plan: - continue with vancomycin 1500 mg IV q12h.  Will plan on checking level if plan is to continue with vancomycin for more than a week - continue zosyn 3.375 gm IV q8h (infuse over 4 hrs) - monitor renal function and bcx ___________________________________________  Height: 5\' 11"  (180.3 cm) Weight: 194 lb 3.6 oz (88.1 kg) IBW/kg (Calculated) : 75.3  Temp (24hrs), Avg:98.7 F (37.1 C), Min:98 F (36.7 C), Max:99.7 F (37.6 C)  Recent Labs  Lab 07/15/19 0316 07/16/19 0417 07/17/19 0355 07/18/19 0351 07/19/19 0344 07/20/19 0349  WBC 8.4 10.9* 12.2* 11.1* 7.6 8.8  CREATININE 0.95 0.93 0.86  --  0.78  0.86 0.79    Estimated Creatinine Clearance: 121.6 mL/min (by C-G formula based on SCr of 0.79 mg/dL).    Allergies  Allergen Reactions  . Nsaids     Crohn's disease   Antimicrobials this admission:  8/30 Zosyn >> 9/1 Vancomycin >>  Microbiology results:  8/26 MRSA PCR: neg 8/29 BCx: BCID 1/4 Staph sp, Meth resistant, ID considers contaminant, BUT leukocytosis (rpt BCx, hold on PICC) 8/31 rpt Bcx x2:   Thank you for allowing pharmacy to be a part of this patient's care.  Lynelle Doctor 07/20/2019 7:41 AM

## 2019-07-20 NOTE — Progress Notes (Signed)
Nutrition Follow-up  INTERVENTION:   -Boost Breeze po TID, each supplement provides 250 kcal and 9 grams of protein -Juven Fruit Punch BID, each serving provides 95kcal and 2.5g of protein (amino acids glutamine and arginine)  NUTRITION DIAGNOSIS:   Increased nutrient needs related to chronic illness, post-op healing as evidenced by estimated needs.  Ongoing.  GOAL:   Patient will meet greater than or equal to 90% of their needs  Progressing.  MONITOR:   PO intake, Supplement acceptance, Labs, Weight trends, I & O's, Skin  ASSESSMENT:   48 y.o. male with a history of HTN and Crohn's disease who presented to the ED with abdominal pain.  Patient reportedly began having severe, constant epigastric pain that radiated to the right side of his abdomen yesterday with associated nausea and greater than 5 episodes of nonbilious emesis.  8/26: s/p exploratory laparatomy with sigmoid colectomy, ileocecectomy end left colostomy, partial cystectomy with repair of bladder   **RD working remotely**  TPN has not been started. Per surgery, advancing diet to clear liquids today. Pt tolerated clears from floor yesterday.  NGT is discontinued. RD to order Boost Breeze and Juven supplements for wound healing.  Admission weight: 184 lbs. No weight measured since 8/26. I/Os: +12.2L since admit UOP 9/2: 1875 ml  Labs reviewed. Medications: D5 and .9% NaCl w/ KCl infusion  Diet Order:   Diet Order            Diet clear liquid Room service appropriate? Yes; Fluid consistency: Thin  Diet effective now              EDUCATION NEEDS:   No education needs have been identified at this time  Skin:  Skin Assessment: Reviewed RN Assessment  Last BM:  9/3  Height:   Ht Readings from Last 1 Encounters:  07/12/19 5\' 11"  (1.803 m)    Weight:   Wt Readings from Last 1 Encounters:  07/12/19 88.1 kg    Ideal Body Weight:  78.1 kg  BMI:  Body mass index is 27.09 kg/m.  Estimated  Nutritional Needs:   Kcal:  2000-2200  Protein:  100-110g  Fluid:  2L/day  Clayton Bibles, MS, RD, LDN Inpatient Clinical Dietitian Pager: [phone] After Hours Pager: [phone]
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
## 2019-07-21 ENCOUNTER — Inpatient Hospital Stay: Payer: Self-pay

## 2019-07-21 ENCOUNTER — Inpatient Hospital Stay (HOSPITAL_COMMUNITY): Payer: Medicaid - Out of State

## 2019-07-21 LAB — CULTURE, BLOOD (ROUTINE X 2)
Culture: NO GROWTH
Special Requests: ADEQUATE

## 2019-07-21 LAB — BASIC METABOLIC PANEL
Anion gap: 9 (ref 5–15)
BUN: 5 mg/dL — ABNORMAL LOW (ref 6–20)
CO2: 24 mmol/L (ref 22–32)
Calcium: 8.5 mg/dL — ABNORMAL LOW (ref 8.9–10.3)
Chloride: 104 mmol/L (ref 98–111)
Creatinine, Ser: 0.76 mg/dL (ref 0.61–1.24)
GFR calc Af Amer: >60 mL/min (ref >60)
GFR calc non Af Amer: >60 mL/min (ref >60)
Glucose, Bld: 107 mg/dL — ABNORMAL HIGH (ref 70–99)
Potassium: 4.1 mmol/L (ref 3.5–5.1)
Sodium: 137 mmol/L (ref 135–145)

## 2019-07-21 LAB — GLUCOSE, CAPILLARY: Glucose-Capillary: 97 mg/dL (ref 70–99)

## 2019-07-21 LAB — PHOSPHORUS: Phosphorus: 2.7 mg/dL (ref 2.5–4.6)

## 2019-07-21 LAB — MAGNESIUM: Magnesium: 2 mg/dL (ref 1.7–2.4)

## 2019-07-21 MED ORDER — INSULIN ASPART 100 UNIT/ML ~~LOC~~ SOLN: 0.0000 [IU] | Freq: Four times a day (QID) | SUBCUTANEOUS | Status: DC

## 2019-07-21 MED ORDER — TRAVASOL 10 % IV SOLN
INTRAVENOUS | Status: AC
  Administered 2019-07-21: 18:00:00 via INTRAVENOUS
  Filled 2019-07-21: qty 528

## 2019-07-21 MED ORDER — IOHEXOL 300 MG/ML  SOLN
100.0000 mL | Freq: Once | INTRAMUSCULAR | Status: AC | PRN
  Administered 2019-07-21: 100 mL via INTRAVENOUS

## 2019-07-21 MED ORDER — PANTOPRAZOLE SODIUM 40 MG PO TBEC
40.0000 mg | DELAYED_RELEASE_TABLET | Freq: Two times a day (BID) | ORAL | Status: DC
  Administered 2019-07-21 – 2019-07-26 (×11): 40 mg via ORAL
  Filled 2019-07-21 (×11): qty 1

## 2019-07-21 MED ORDER — SODIUM CHLORIDE 0.9% FLUSH
10.0000 mL | INTRAVENOUS | Status: DC | PRN
  Administered 2019-07-21 – 2019-07-24 (×2): 10 mL
  Filled 2019-07-21 (×2): qty 40

## 2019-07-21 MED ORDER — ENSURE ENLIVE PO LIQD
237.0000 mL | ORAL | Status: DC
  Administered 2019-07-23 – 2019-07-26 (×2): 237 mL via ORAL

## 2019-07-21 MED ORDER — SODIUM CHLORIDE (PF) 0.9 % IJ SOLN
INTRAMUSCULAR | Status: AC
  Filled 2019-07-21: qty 50

## 2019-07-21 MED ORDER — SODIUM CHLORIDE 0.9% FLUSH
10.0000 mL | Freq: Two times a day (BID) | INTRAVENOUS | Status: DC
  Administered 2019-07-26: 10 mL

## 2019-07-21 MED ORDER — KCL IN DEXTROSE-NACL 20-5-0.9 MEQ/L-%-% IV SOLN
INTRAVENOUS | Status: AC
  Administered 2019-07-21: 19:00:00 via INTRAVENOUS
  Filled 2019-07-21 (×2): qty 1000

## 2019-07-21 MED ORDER — IOHEXOL 300 MG/ML  SOLN
30.0000 mL | Freq: Once | INTRAMUSCULAR | Status: AC | PRN
  Administered 2019-07-21: 30 mL via ORAL

## 2019-07-21 NOTE — Progress Notes (Signed)
Patient ID: Kyle Hampton, male   DOB: 26-Mar-1971, 48 y.o.   MRN: [id number]          [place]    Date of Admission:  07/11/2019   Day 6 Pipracil and tazobactam        Day 4 vancomycin  He is now afebrile and general surgery reports his wound is looking better.  I recommend continuing antibiotics for 2 more days.  Please call Dr. Carlyle Basques [phone]) for any infectious disease questions this weekend.         Michel Bickers, MD [facility] [place] [facility] [phone] pager   [phone] cell 07/21/2019, 9:47 AM

## 2019-07-21 NOTE — Progress Notes (Addendum)
PHARMACY - ADULT TOTAL PARENTERAL NUTRITION CONSULT NOTE   Pharmacy Consult for TPN Indication: SBO; poor oral intake  Patient Measurements: Height: 5\' 11"  (180.3 cm) Weight: 194 lb 3.6 oz (88.1 kg) IBW/kg (Calculated) : 75.3 TPN AdjBW (KG): 88.1 Body mass index is 27.09 kg/m. Usual Weight:   Insulin Requirements: not on insulin - no hx DM  Current Nutrition:  - Juven 1 packet bid and Boost 1 can tid (started on 9/3)  IVF: D5 NS with 20 mEq/L KCL at 100 ml/hr  Central access: PICC pending TPN start date: planning to start on 9/4 once PICC is placed  ASSESSMENT                                                                                                          HPI: Patient is a 48 y.o M with hx Crohn's disease, presented to the ED on 8/25 with c/o abdominal pain and n/v.  He was found to have intra-abdominal mass with SBO.  Patient underwent expl lap with removal intra-abdominal mass, ileocecectomy, sigmoid colectomy with left end colostomy, and  partial cystectomy withrepairof thebladder on 07/12/2019.  He had post-op fever and wound infection and started on abx.  TPN initiation and PICC placement were postponed d/t fever/infection.  With patient being afebrile for >24 hrs, CCS recom to place PICC and start TPN on 9/4.  Significant events:  - 8/26: OR  - 9/3: adv diet to clears; NGT d/ced   Today:   Glucose (goal <150): no hx DM; 107 with BMET  Electrolytes: K 4.1, CorrCa 10, phos 2.7, Mag 2; CL and CO2 wnl;   Renal: scr ok  LFTs: WNL with labs on 8/31  TGs: pending  Prealbumin: 5.4 (8/31)  NUTRITIONAL GOALS                                                                                          RD recs: Kcal:  2000-2200 Protein:  100-110g Fluid:  2L/day  Custom TPN at goal rate of 80 ml/hr provides: -  105 g/day protein ( 55  g/L) -  58 g/day Lipid  ( 58 g/L) -  346 g/day Dextrose ( 18 %) -  2173 Kcal/day  PLAN  Once PICC is placed, At 1800 today:  Start TPN at 40  ml/hr. -  Provides 53 g of protein, 173 g of dextrose, and 29 g of lipids which provides 1087 kCals per day, meeting 54 % of goal kcal and goal AA  Electrolytes in TPN: Standard, Cl:Ac ratio 1:1  Plan to advance as tolerated to the goal rate.  TPN to contain standard multivitamins and trace elements.  Reduce IVF to 60 ml/hr.  Add sensitive SSI q6h.   TPN lab panels on Mondays & Thursdays.  F/u daily.   Nickolette Espinola P 07/21/2019,9:49 AM
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
## 2019-07-21 NOTE — Consult Note (Signed)
[place] Nurse ostomy follow up Stoma type/location: LLQ colostomy Stomal assessment/size: 2 inches, round, red, raised with os at center. Peristomal assessment: Not seen today Treatment options for stomal/peristomal skin:  Output: Liquid brown stool. Patient with frequent flatus. Ostomy pouching: 2pc. 2 and 3/4 pouching system with skin barrier ring applied Wednesday is intact.  Next change scheduled for Monday. Education provided: Patient allowed Bedside RN to empty today, he is able to perform emptying skill and is encouraged to take over this aspect of his care with oversight by Nursing for I&O monitoring. Same communicated to bedside RN and OCA, Terrie. Enrolled patient in [place] Start Discharge program: Yes  [facility] nursing team will not follow, but will remain available to this patient, the nursing and medical teams.  Please re-consult if needed. Thanks, Maudie Flakes, MSN, RN, [place], Arther Abbott  Pager# [phone]

## 2019-07-21 NOTE — Progress Notes (Signed)
Nutrition Follow-up  INTERVENTION:   -TPN per Pharmacy -Continue Juven Fruit Punch BID, each serving provides 95kcal and 2.5g of protein (amino acids glutamine and arginine) -Provide Ensure Enlive po daily, each supplement provides 350 kcal and 20 grams of protein  NUTRITION DIAGNOSIS:   Increased nutrient needs related to chronic illness, post-op healing as evidenced by estimated needs.  Ongoing.  GOAL:   Patient will meet greater than or equal to 90% of their needs  Not meeting.  MONITOR:   PO intake, Supplement acceptance, Labs, Weight trends, I & O's, Skin  REASON FOR ASSESSMENT:   Consult New TPN/TNA  ASSESSMENT:   48 y.o. male with a history of HTN and Crohn's disease who presented to the ED with abdominal pain.  Patient reportedly began having severe, constant epigastric pain that radiated to the right side of his abdomen yesterday with associated nausea and greater than 5 episodes of nonbilious emesis.  8/26: s/pexploratory laparatomy with sigmoid colectomy, ileocecectomy end left colostomy, partial cystectomy with repair of bladder  **RD working remotely**  Patient to begin TPN today following PICC line placement. Will begin at 40 ml/hr. Pt not taking in much PO. Now on full liquids. Pt not drinking Boost Breeze supplements, will d/c. Will try Ensure and continue Juven for wound healing.  Admission weight: 184 lbs.  No new weights have been measured since admission. I/Os: +13L since admit UOP 9/3: 1775 ml  Labs reviewed. Medications: D5 and .9% NaCl w/ KCl infusion  Diet Order:   Diet Order            Diet full liquid Room service appropriate? Yes; Fluid consistency: Thin  Diet effective now              EDUCATION NEEDS:   No education needs have been identified at this time  Skin:  Skin Assessment: Reviewed RN Assessment  Last BM:  9/3  Height:   Ht Readings from Last 1 Encounters:  07/12/19 5\' 11"  (1.803 m)    Weight:   Wt  Readings from Last 1 Encounters:  07/12/19 88.1 kg    Ideal Body Weight:  78.1 kg  BMI:  Body mass index is 27.09 kg/m.  Estimated Nutritional Needs:   Kcal:  2000-2200  Protein:  100-110g  Fluid:  2L/day  Clayton Bibles, MS, RD, LDN Inpatient Clinical Dietitian Pager: [phone] After Hours Pager: [phone]

## 2019-07-21 NOTE — Progress Notes (Signed)
Peripherally Inserted Central Catheter/Midline Placement  The IV Nurse has discussed with the patient and/or persons authorized to consent for the patient, the purpose of this procedure and the potential benefits and risks involved with this procedure.  The benefits include less needle sticks, lab draws from the catheter, and the patient may be discharged home with the catheter. Risks include, but not limited to, infection, bleeding, blood clot (thrombus formation), and puncture of an artery; nerve damage and irregular heartbeat and possibility to perform a PICC exchange if needed/ordered by physician.  Alternatives to this procedure were also discussed.  Bard Power PICC patient education guide, fact sheet on infection prevention and patient information card has been provided to patient /or left at bedside.    PICC/Midline Placement Documentation  PICC Double Lumen 07/21/19 PICC Left Brachial 50 cm 3 cm (Active)  Indication for Insertion or Continuance of Line Administration of hyperosmolar/irritating solutions (i.e. TPN, Vancomycin, etc.) 07/21/19 1200  Exposed Catheter (cm) 3 cm 07/21/19 1200  Site Assessment Clean;Dry;Intact 07/21/19 1200  Lumen #1 Status Flushed;Blood return noted 07/21/19 1200  Lumen #2 Status Flushed;Blood return noted 07/21/19 1200  Dressing Type Transparent 07/21/19 1200  Dressing Status Clean;Dry;Intact;Antimicrobial disc in place 07/21/19 1200  Dressing Change Due 07/28/19 07/21/19 1200       Jule Economy Horton 07/21/2019, 12:42 PM
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
## 2019-07-22 DIAGNOSIS — Z933 Colostomy status: Secondary | ICD-10-CM

## 2019-07-22 DIAGNOSIS — N321 Vesicointestinal fistula: Secondary | ICD-10-CM

## 2019-07-22 LAB — CBC
HCT: 35.5 % — ABNORMAL LOW (ref 39.0–52.0)
Hemoglobin: 11.4 g/dL — ABNORMAL LOW (ref 13.0–17.0)
MCH: 27.3 pg (ref 26.0–34.0)
MCHC: 32.1 g/dL (ref 30.0–36.0)
MCV: 85.1 fL (ref 80.0–100.0)
Platelets: 423 10*3/uL — ABNORMAL HIGH (ref 150–400)
RBC: 4.17 MIL/uL — ABNORMAL LOW (ref 4.22–5.81)
RDW: 14 % (ref 11.5–15.5)
WBC: 11.1 10*3/uL — ABNORMAL HIGH (ref 4.0–10.5)
nRBC: 0 % (ref 0.0–0.2)

## 2019-07-22 LAB — CULTURE, BLOOD (ROUTINE X 2)
Culture: NO GROWTH
Culture: NO GROWTH
Special Requests: ADEQUATE

## 2019-07-22 LAB — MAGNESIUM: Magnesium: 2.1 mg/dL (ref 1.7–2.4)

## 2019-07-22 LAB — COMPREHENSIVE METABOLIC PANEL
ALT: 56 U/L — ABNORMAL HIGH (ref 0–44)
AST: 60 U/L — ABNORMAL HIGH (ref 15–41)
Albumin: 2.3 g/dL — ABNORMAL LOW (ref 3.5–5.0)
Alkaline Phosphatase: 118 U/L (ref 38–126)
Anion gap: 8 (ref 5–15)
BUN: 6 mg/dL (ref 6–20)
CO2: 26 mmol/L (ref 22–32)
Calcium: 8.5 mg/dL — ABNORMAL LOW (ref 8.9–10.3)
Chloride: 104 mmol/L (ref 98–111)
Creatinine, Ser: 0.82 mg/dL (ref 0.61–1.24)
GFR calc Af Amer: >60 mL/min (ref >60)
GFR calc non Af Amer: >60 mL/min (ref >60)
Glucose, Bld: 104 mg/dL — ABNORMAL HIGH (ref 70–99)
Potassium: 3.4 mmol/L — ABNORMAL LOW (ref 3.5–5.1)
Sodium: 138 mmol/L (ref 135–145)
Total Bilirubin: 0.6 mg/dL (ref 0.3–1.2)
Total Protein: 6.6 g/dL (ref 6.5–8.1)

## 2019-07-22 LAB — DIFFERENTIAL
Abs Immature Granulocytes: 0.23 10*3/uL — ABNORMAL HIGH (ref 0.00–0.07)
Basophils Absolute: 0 10*3/uL (ref 0.0–0.1)
Basophils Relative: 0 %
Eosinophils Absolute: 0.2 10*3/uL (ref 0.0–0.5)
Eosinophils Relative: 2 %
Immature Granulocytes: 2 %
Lymphocytes Relative: 7 %
Lymphs Abs: 0.8 10*3/uL (ref 0.7–4.0)
Monocytes Absolute: 1 10*3/uL (ref 0.1–1.0)
Monocytes Relative: 9 %
Neutro Abs: 8.9 10*3/uL — ABNORMAL HIGH (ref 1.7–7.7)
Neutrophils Relative %: 80 %

## 2019-07-22 LAB — TRIGLYCERIDES: Triglycerides: 245 mg/dL — ABNORMAL HIGH (ref <150)

## 2019-07-22 LAB — GLUCOSE, CAPILLARY
Glucose-Capillary: 104 mg/dL — ABNORMAL HIGH (ref 70–99)
Glucose-Capillary: 107 mg/dL — ABNORMAL HIGH (ref 70–99)
Glucose-Capillary: 108 mg/dL — ABNORMAL HIGH (ref 70–99)
Glucose-Capillary: 112 mg/dL — ABNORMAL HIGH (ref 70–99)
Glucose-Capillary: 93 mg/dL (ref 70–99)

## 2019-07-22 LAB — PREALBUMIN: Prealbumin: 16.6 mg/dL — ABNORMAL LOW (ref 18–38)

## 2019-07-22 LAB — PHOSPHORUS: Phosphorus: 3.7 mg/dL (ref 2.5–4.6)

## 2019-07-22 MED ORDER — SODIUM CHLORIDE 0.9% FLUSH: 3.0000 mL | INTRAVENOUS | Status: DC | PRN

## 2019-07-22 MED ORDER — DIPHENHYDRAMINE HCL 50 MG/ML IJ SOLN
12.5000 mg | Freq: Four times a day (QID) | INTRAMUSCULAR | Status: DC | PRN
  Administered 2019-07-25: 25 mg via INTRAVENOUS
  Filled 2019-07-22: qty 1

## 2019-07-22 MED ORDER — TRAVASOL 10 % IV SOLN
INTRAVENOUS | Status: AC
  Administered 2019-07-22: 18:00:00 via INTRAVENOUS
  Filled 2019-07-22: qty 792

## 2019-07-22 MED ORDER — PSYLLIUM 95 % PO PACK
1.0000 | PACK | Freq: Every day | ORAL | Status: DC
  Administered 2019-07-23: 1 via ORAL
  Filled 2019-07-22 (×2): qty 1

## 2019-07-22 MED ORDER — HYDROCORTISONE 1 % EX CREA: 1.0000 "application " | TOPICAL_CREAM | Freq: Three times a day (TID) | CUTANEOUS | Status: DC | PRN

## 2019-07-22 MED ORDER — GUAIFENESIN-DM 100-10 MG/5ML PO SYRP: 10.0000 mL | ORAL_SOLUTION | ORAL | Status: DC | PRN

## 2019-07-22 MED ORDER — ALUM & MAG HYDROXIDE-SIMETH 200-200-20 MG/5ML PO SUSP
30.0000 mL | Freq: Four times a day (QID) | ORAL | Status: DC | PRN
  Administered 2019-07-24: 30 mL via ORAL
  Filled 2019-07-22: qty 30

## 2019-07-22 MED ORDER — SODIUM CHLORIDE 0.9 % IV SOLN: 250.0000 mL | INTRAVENOUS | Status: DC | PRN

## 2019-07-22 MED ORDER — POTASSIUM CHLORIDE 10 MEQ/100ML IV SOLN
10.0000 meq | INTRAVENOUS | Status: AC
  Administered 2019-07-22 (×4): 10 meq via INTRAVENOUS
  Filled 2019-07-22 (×4): qty 100

## 2019-07-22 MED ORDER — SACCHAROMYCES BOULARDII 250 MG PO CAPS
250.0000 mg | ORAL_CAPSULE | Freq: Two times a day (BID) | ORAL | Status: DC
  Administered 2019-07-22 – 2019-07-23 (×3): 250 mg via ORAL
  Filled 2019-07-22 (×3): qty 1

## 2019-07-22 MED ORDER — SODIUM CHLORIDE 0.9% FLUSH
3.0000 mL | Freq: Two times a day (BID) | INTRAVENOUS | Status: DC
  Administered 2019-07-22 – 2019-07-25 (×4): 3 mL via INTRAVENOUS

## 2019-07-22 MED ORDER — HYDROCORTISONE (PERIANAL) 2.5 % EX CREA: 1.0000 "application " | TOPICAL_CREAM | Freq: Four times a day (QID) | CUTANEOUS | Status: DC | PRN

## 2019-07-22 MED ORDER — KCL IN DEXTROSE-NACL 20-5-0.9 MEQ/L-%-% IV SOLN
INTRAVENOUS | Status: DC
  Administered 2019-07-23 – 2019-07-24 (×2): via INTRAVENOUS
  Filled 2019-07-22 (×2): qty 1000
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
## 2019-07-22 NOTE — Progress Notes (Signed)
PHARMACY - ADULT TOTAL PARENTERAL NUTRITION CONSULT NOTE   Pharmacy Consult for TPN Indication: SBO; poor oral intake  Patient Measurements: Height: 5\' 11"  (180.3 cm) Weight: 194 lb 3.6 oz (88.1 kg) IBW/kg (Calculated) : 75.3 TPN AdjBW (KG): 88.1 Body mass index is 27.09 kg/m. Usual Weight:   Insulin Requirements: no insulin given - no hx DM  Current Nutrition:  - ensure enlive daily and Juven BID - TPN  IVF: D5 NS with 20 mEq/L KCL at 60 ml/hr  Central access: PICC 9/4 TPN start date: 9/4  ASSESSMENT                                                                                                          HPI: Patient is a 48 y.o M with hx Crohn's disease, presented to the ED on 8/25 with c/o abdominal pain and n/v.  He was found to have intra-abdominal mass with SBO.  Patient underwent expl lap with removal intra-abdominal mass, ileocecectomy, sigmoid colectomy with left end colostomy, and  partial cystectomy withrepairof thebladder on 07/12/2019.  He had post-op fever and wound infection and started on abx.  TPN initiation and PICC placement were postponed d/t fever/infection.  With patient being afebrile for >24 hrs, CCS recom to place PICC and start TPN on 9/4.  Significant events:  - 8/26: OR  - 9/3: adv diet to clears; NGT d/ced   Today:   Glucose (goal <150): no hx DM;   Electrolytes: K 3.4, CorrCa 10, phos 3.7, Mag 2.1; CL and CO2 wnl;   Renal: scr ok  LFTs: slightly elevated  TGs: 245  Prealbumin: 5.4 (8/31), 16.6 (9/5)  NUTRITIONAL GOALS                                                                                          RD recs: Kcal:  2000-2200 Protein:  100-110g Fluid:  2L/day  Custom TPN at goal rate of 80 ml/hr provides: -  105 g/day protein ( 55  g/L) -  58 g/day Lipid  ( 58 g/L) -  346 g/day Dextrose ( 18 %) -  2173 Kcal/day  PLAN  KCl 10meq IV x 4  At 1800 today:  increase TPN to 60  ml/hr.  Electrolytes in TPN: Standard, Cl:Ac ratio 1:1  Plan to advance as tolerated to the goal rate.  TPN to contain standard multivitamins and trace elements.  Reduce IVF to 40 ml/hr.  continue sensitive SSI q6h.   BMet with mag and phos tomorrow  TPN lab panels on Mondays & Thursdays.  F/u daily.   Kyle Hampton RPh 07/22/2019, 8:31 AM Pager [phone]

## 2019-07-23 DIAGNOSIS — F411 Generalized anxiety disorder: Secondary | ICD-10-CM

## 2019-07-23 LAB — BASIC METABOLIC PANEL
Anion gap: 8 (ref 5–15)
BUN: 6 mg/dL (ref 6–20)
CO2: 26 mmol/L (ref 22–32)
Calcium: 8.6 mg/dL — ABNORMAL LOW (ref 8.9–10.3)
Chloride: 103 mmol/L (ref 98–111)
Creatinine, Ser: 0.74 mg/dL (ref 0.61–1.24)
GFR calc Af Amer: >60 mL/min (ref >60)
GFR calc non Af Amer: >60 mL/min (ref >60)
Glucose, Bld: 114 mg/dL — ABNORMAL HIGH (ref 70–99)
Potassium: 3.7 mmol/L (ref 3.5–5.1)
Sodium: 137 mmol/L (ref 135–145)

## 2019-07-23 LAB — GLUCOSE, CAPILLARY
Glucose-Capillary: 108 mg/dL — ABNORMAL HIGH (ref 70–99)
Glucose-Capillary: 102 mg/dL — ABNORMAL HIGH (ref 70–99)
Glucose-Capillary: 94 mg/dL (ref 70–99)

## 2019-07-23 LAB — MAGNESIUM: Magnesium: 2.4 mg/dL (ref 1.7–2.4)

## 2019-07-23 LAB — PHOSPHORUS: Phosphorus: 4.2 mg/dL (ref 2.5–4.6)

## 2019-07-23 MED ORDER — METHOCARBAMOL 500 MG PO TABS
750.0000 mg | ORAL_TABLET | Freq: Three times a day (TID) | ORAL | Status: DC
  Administered 2019-07-23 – 2019-07-25 (×6): 750 mg via ORAL
  Filled 2019-07-23 (×6): qty 2

## 2019-07-23 MED ORDER — AMLODIPINE BESYLATE 10 MG PO TABS
10.0000 mg | ORAL_TABLET | Freq: Every day | ORAL | Status: DC
  Administered 2019-07-23 – 2019-07-26 (×4): 10 mg via ORAL
  Filled 2019-07-23 (×4): qty 1

## 2019-07-23 MED ORDER — GABAPENTIN 100 MG PO CAPS
200.0000 mg | ORAL_CAPSULE | Freq: Three times a day (TID) | ORAL | Status: DC
  Administered 2019-07-23 – 2019-07-25 (×6): 200 mg via ORAL
  Filled 2019-07-23 (×6): qty 2

## 2019-07-23 MED ORDER — ACETAMINOPHEN 500 MG PO TABS
1000.0000 mg | ORAL_TABLET | Freq: Four times a day (QID) | ORAL | Status: DC
  Administered 2019-07-23 – 2019-07-26 (×11): 1000 mg via ORAL
  Filled 2019-07-23 (×13): qty 2

## 2019-07-23 MED ORDER — PSYLLIUM 95 % PO PACK
1.0000 | PACK | Freq: Two times a day (BID) | ORAL | Status: DC
  Administered 2019-07-23 – 2019-07-24 (×3): 1 via ORAL
  Filled 2019-07-23 (×3): qty 1

## 2019-07-23 MED ORDER — TRAVASOL 10 % IV SOLN
INTRAVENOUS | Status: DC
  Administered 2019-07-23: 18:00:00 via INTRAVENOUS
  Filled 2019-07-23: qty 792
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
## 2019-07-23 NOTE — Progress Notes (Signed)
PHARMACY - ADULT TOTAL PARENTERAL NUTRITION CONSULT NOTE   Pharmacy Consult for TPN Indication: SBO; poor oral intake  Patient Measurements: Height: 5\' 11"  (180.3 cm) Weight: 194 lb 3.6 oz (88.1 kg) IBW/kg (Calculated) : 75.3 TPN AdjBW (KG): 88.1 Body mass index is 27.09 kg/m. Usual Weight:   Insulin Requirements: no insulin given - no hx DM  Current Nutrition:  - ensure enlive daily and Juven BID - TPN - soft diet  IVF: D5 NS with 20 mEq/L KCL at 40 ml/hr  Central access: PICC 9/4 TPN start date: 9/4  ASSESSMENT                                                                                                          HPI: Patient is a 48 y.o M with hx Crohn's disease, presented to the ED on 8/25 with c/o abdominal pain and n/v.  He was found to have intra-abdominal mass with SBO.  Patient underwent expl lap with removal intra-abdominal mass, ileocecectomy, sigmoid colectomy with left end colostomy, and  partial cystectomy withrepairof thebladder on 07/12/2019.  He had post-op fever and wound infection and started on abx.  TPN initiation and PICC placement were postponed d/t fever/infection.  With patient being afebrile for >24 hrs, CCS recom to place PICC and start TPN on 9/4.  Significant events:  - 8/26: OR  - 9/3: adv diet to clears; NGT d/ced   Today:   Glucose (goal <150): no hx DM;   Electrolytes: K 3.7 repleated, others wnl   Renal: scr ok  LFTs: slightly elevated  TGs: 245  Prealbumin: 5.4 (8/31), 16.6 (9/5)  NUTRITIONAL GOALS                                                                                          RD recs: Kcal:  2000-2200 Protein:  100-110g Fluid:  2L/day  Custom TPN at goal rate of 80 ml/hr provides: -  105 g/day protein ( 55  g/L) -  58 g/day Lipid  ( 58 g/L) -  346 g/day Dextrose ( 18 %) -  2173 Kcal/day  PLAN  At 1800 today:  continue TPN at 60 ml/hr, will not increase as pt has diet ordered   Electrolytes in TPN: Standard, Cl:Ac ratio 1:1  Plan to advance as tolerated to the goal rate.  TPN to contain standard multivitamins and trace elements.  continue IVF to 40 ml/hr.  continue sensitive SSI q6h.   TPN lab panels on Mondays & Thursdays.  F/u daily.   Kyle Hampton RPh 07/23/2019, 9:49 AM Pager [phone]

## 2019-07-24 LAB — COMPREHENSIVE METABOLIC PANEL
ALT: 37 U/L (ref 0–44)
AST: 21 U/L (ref 15–41)
Albumin: 2.4 g/dL — ABNORMAL LOW (ref 3.5–5.0)
Alkaline Phosphatase: 112 U/L (ref 38–126)
Anion gap: 8 (ref 5–15)
BUN: 11 mg/dL (ref 6–20)
CO2: 25 mmol/L (ref 22–32)
Calcium: 8.7 mg/dL — ABNORMAL LOW (ref 8.9–10.3)
Chloride: 103 mmol/L (ref 98–111)
Creatinine, Ser: 0.81 mg/dL (ref 0.61–1.24)
GFR calc Af Amer: >60 mL/min (ref >60)
GFR calc non Af Amer: >60 mL/min (ref >60)
Glucose, Bld: 111 mg/dL — ABNORMAL HIGH (ref 70–99)
Potassium: 4 mmol/L (ref 3.5–5.1)
Sodium: 136 mmol/L (ref 135–145)
Total Bilirubin: 0.3 mg/dL (ref 0.3–1.2)
Total Protein: 6.9 g/dL (ref 6.5–8.1)

## 2019-07-24 LAB — CBC
HCT: 35.5 % — ABNORMAL LOW (ref 39.0–52.0)
Hemoglobin: 11.4 g/dL — ABNORMAL LOW (ref 13.0–17.0)
MCH: 27.7 pg (ref 26.0–34.0)
MCHC: 32.1 g/dL (ref 30.0–36.0)
MCV: 86.2 fL (ref 80.0–100.0)
Platelets: 545 10*3/uL — ABNORMAL HIGH (ref 150–400)
RBC: 4.12 MIL/uL — ABNORMAL LOW (ref 4.22–5.81)
RDW: 14.2 % (ref 11.5–15.5)
WBC: 13.8 10*3/uL — ABNORMAL HIGH (ref 4.0–10.5)
nRBC: 0 % (ref 0.0–0.2)

## 2019-07-24 LAB — GLUCOSE, CAPILLARY
Glucose-Capillary: 102 mg/dL — ABNORMAL HIGH (ref 70–99)
Glucose-Capillary: 112 mg/dL — ABNORMAL HIGH (ref 70–99)
Glucose-Capillary: 129 mg/dL — ABNORMAL HIGH (ref 70–99)
Glucose-Capillary: 96 mg/dL (ref 70–99)
Glucose-Capillary: 97 mg/dL (ref 70–99)

## 2019-07-24 LAB — DIFFERENTIAL
Abs Immature Granulocytes: 0.26 10*3/uL — ABNORMAL HIGH (ref 0.00–0.07)
Basophils Absolute: 0.1 10*3/uL (ref 0.0–0.1)
Basophils Relative: 0 %
Eosinophils Absolute: 0.3 10*3/uL (ref 0.0–0.5)
Eosinophils Relative: 2 %
Immature Granulocytes: 2 %
Lymphocytes Relative: 8 %
Lymphs Abs: 1.1 10*3/uL (ref 0.7–4.0)
Monocytes Absolute: 1.2 10*3/uL — ABNORMAL HIGH (ref 0.1–1.0)
Monocytes Relative: 9 %
Neutro Abs: 10.9 10*3/uL — ABNORMAL HIGH (ref 1.7–7.7)
Neutrophils Relative %: 79 %

## 2019-07-24 LAB — PHOSPHORUS: Phosphorus: 4 mg/dL (ref 2.5–4.6)

## 2019-07-24 LAB — PREALBUMIN: Prealbumin: 19.6 mg/dL (ref 18–38)

## 2019-07-24 LAB — MAGNESIUM: Magnesium: 2.3 mg/dL (ref 1.7–2.4)

## 2019-07-24 LAB — TRIGLYCERIDES: Triglycerides: 219 mg/dL — ABNORMAL HIGH (ref <150)

## 2019-07-24 MED ORDER — HYDROMORPHONE HCL 1 MG/ML IJ SOLN
1.0000 mg | INTRAMUSCULAR | Status: DC | PRN
  Administered 2019-07-24 – 2019-07-25 (×3): 2 mg via INTRAVENOUS
  Filled 2019-07-24: qty 2
  Filled 2019-07-24 (×2): qty 1
  Filled 2019-07-24 (×2): qty 2

## 2019-07-24 MED ORDER — HYDROMORPHONE HCL 1 MG/ML IJ SOLN: 1.0000 mg | INTRAMUSCULAR | Status: DC

## 2019-07-24 MED ORDER — INSULIN ASPART 100 UNIT/ML ~~LOC~~ SOLN
0.0000 [IU] | Freq: Three times a day (TID) | SUBCUTANEOUS | Status: DC
  Administered 2019-07-24 – 2019-07-25 (×2): 1 [IU] via SUBCUTANEOUS

## 2019-07-24 MED ORDER — HYDROMORPHONE HCL 1 MG/ML IJ SOLN: 1.0000 mg | INTRAMUSCULAR | Status: DC | PRN

## 2019-07-24 MED ORDER — TRAVASOL 10 % IV SOLN
INTRAVENOUS | Status: AC
  Filled 2019-07-24: qty 396

## 2019-07-24 NOTE — Progress Notes (Signed)
PT Cancellation Note  Patient Details Name: Kyle Hampton MRN: [id number] DOB: 21-Nov-1970   Cancelled Treatment:    Reason Eval/Treat Not Completed: Other (comment) patient sleeping soundly. Will attempt to try back later today if time/schedule allow.    Deniece Ree PT, DPT, CBIS  Supplemental Physical Therapist [facility]    Pager [phone] Acute Rehab Office [phone]

## 2019-07-24 NOTE — Consult Note (Addendum)
[place] Nurse ostomy follow up Patient receiving care in [place] Stoma type/location: LLQ colostomy Stomal assessment/size: Between 1 3/4 inches and 2 inches,  red, moist, sutures intact, productive of brown stool and gas Peristomal assessment: intact Treatment options for stomal/peristomal skin: barrier ring Output: brown  Ostomy pouching: 2pc. Flat cut to fit Education provided: Patient removed existing pouching system. Cleaned around stoma with water only. Cut new opening to skin barrier. Placed barrier ring on the skin barrier. Assisted with placing new skin barrier onto abdomen. Snapped pouch onto barrier, closed pouch. Also, he correctly stated the steps to emptying pouch. Additional supplies at bedside. Enrolled patient in [place] Start Discharge program: Yes Val Riles, RN, MSN, [facility], CNS-BC, pager [phone]

## 2019-07-24 NOTE — Progress Notes (Signed)
Pt complaining of increased abdominal pain and low ostomy output. Pt stated "it feels like stool is stuck but won't go through the hole." RN discussed with pt about walking and oral pain medication. Pt stated that "the oxy doesn't help, I don't want it." Pt given dilaudid and simethicone with no relief. Pt requested RN notify MD. MD notified. New orders placed for pain medication.

## 2019-07-24 NOTE — Progress Notes (Signed)
PHARMACY - ADULT TOTAL PARENTERAL NUTRITION CONSULT NOTE   Pharmacy Consult for TPN Indication: SBO; poor oral intake  Patient Measurements: Height: 5\' 11"  (180.3 cm) Weight: 194 lb 3.6 oz (88.1 kg) IBW/kg (Calculated) : 75.3 TPN AdjBW (KG): 88.1 Body mass index is 27.09 kg/m. Usual Weight:   Insulin Requirements: no insulin given - no hx DM  Current Nutrition:  -Heart healthy, thin  IVF: D5 NS with 20 mEq/L KCL at 10 mL/hr per MD  Central access: PICC 9/4 TPN start date: 9/4  ASSESSMENT                                                                                                          HPI: Patient is a 48 y.o M with hx Crohn's disease, presented to the ED on 8/25 with c/o abdominal pain and n/v.  He was found to have intra-abdominal mass with SBO.  Patient underwent expl lap with removal intra-abdominal mass, ileocecectomy, sigmoid colectomy with left end colostomy, and  partial cystectomy withrepairof thebladder on 07/12/2019.  He had post-op fever and wound infection and started on abx.  TPN initiation and PICC placement were postponed d/t fever/infection.  With patient being afebrile for >24 hrs, CCS recom to place PICC and start TPN on 9/4.  Significant events:  - 8/26: OR  - 9/3: adv diet to clears; NGT d/ced -9/7: Weaning off of TPN per surgery   Today:   Glucose (goal <150): no hx DM;   Electrolytes: WNL  Renal: SCr stable, WNL  LFTs: WNL  TGs: 219 - elevated but decreasing  Prealbumin: 5.4 (8/31), 16.6 (9/5), 19.6 (9/7)  NUTRITIONAL GOALS                                                            RD recs (9/4): Kcal:  2000-2200 Protein:  100-110g Fluid:  2L /day  Custom TPN at goal rate of 80 ml/hr provides: -  105 g/day protein ( 55  g/L) -  58 g/day Lipid  ( 58 g/L) -  346 g/day Dextrose ( 18 %) -  2173 Kcal/day  PLAN                                                                                       Per surgery, wean off of TPN  today.  Reduce rate of TPN to 30 mL/hr (half of current rate) for 3 hours then discontinue TPN.   All associated TPN lab orders discontinued.   Lenis Noon, PharmD 07/24/19 9:11  AM
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
## 2019-07-25 ENCOUNTER — Inpatient Hospital Stay (HOSPITAL_COMMUNITY): Payer: Medicaid - Out of State

## 2019-07-25 LAB — HEMOGLOBIN A1C
Hgb A1c MFr Bld: 5.5 % (ref 4.8–5.6)
Mean Plasma Glucose: 111 mg/dL

## 2019-07-25 LAB — CBC
HCT: 35.8 % — ABNORMAL LOW (ref 39.0–52.0)
Hemoglobin: 11.3 g/dL — ABNORMAL LOW (ref 13.0–17.0)
MCH: 27.3 pg (ref 26.0–34.0)
MCHC: 31.6 g/dL (ref 30.0–36.0)
MCV: 86.5 fL (ref 80.0–100.0)
Platelets: 585 10*3/uL — ABNORMAL HIGH (ref 150–400)
RBC: 4.14 MIL/uL — ABNORMAL LOW (ref 4.22–5.81)
RDW: 14 % (ref 11.5–15.5)
WBC: 12.9 10*3/uL — ABNORMAL HIGH (ref 4.0–10.5)
nRBC: 0 % (ref 0.0–0.2)

## 2019-07-25 LAB — GLUCOSE, CAPILLARY
Glucose-Capillary: 105 mg/dL — ABNORMAL HIGH (ref 70–99)
Glucose-Capillary: 132 mg/dL — ABNORMAL HIGH (ref 70–99)
Glucose-Capillary: 98 mg/dL (ref 70–99)

## 2019-07-25 MED ORDER — TRAMADOL HCL 50 MG PO TABS
50.0000 mg | ORAL_TABLET | Freq: Four times a day (QID) | ORAL | Status: DC
  Administered 2019-07-25 – 2019-07-26 (×6): 50 mg via ORAL
  Filled 2019-07-25 (×6): qty 1

## 2019-07-25 MED ORDER — METHOCARBAMOL 500 MG PO TABS
750.0000 mg | ORAL_TABLET | Freq: Three times a day (TID) | ORAL | Status: DC
  Administered 2019-07-25 – 2019-07-26 (×4): 750 mg via ORAL
  Filled 2019-07-25 (×4): qty 2

## 2019-07-25 MED ORDER — HYDROMORPHONE HCL 1 MG/ML IJ SOLN: 1.0000 mg | INTRAMUSCULAR | Status: DC | PRN

## 2019-07-25 MED ORDER — DOCUSATE SODIUM 100 MG PO CAPS
100.0000 mg | ORAL_CAPSULE | Freq: Two times a day (BID) | ORAL | Status: DC
  Administered 2019-07-25 – 2019-07-26 (×3): 100 mg via ORAL
  Filled 2019-07-25 (×3): qty 1

## 2019-07-25 MED ORDER — LORAZEPAM 0.5 MG PO TABS
0.5000 mg | ORAL_TABLET | Freq: Four times a day (QID) | ORAL | Status: DC | PRN
  Administered 2019-07-25: 0.5 mg via ORAL
  Filled 2019-07-25: qty 1

## 2019-07-25 MED ORDER — GABAPENTIN 300 MG PO CAPS
300.0000 mg | ORAL_CAPSULE | Freq: Three times a day (TID) | ORAL | Status: DC
  Administered 2019-07-25 – 2019-07-26 (×4): 300 mg via ORAL
  Filled 2019-07-25 (×4): qty 1

## 2019-07-25 MED ORDER — POLYETHYLENE GLYCOL 3350 17 G PO PACK
17.0000 g | PACK | Freq: Every day | ORAL | Status: DC
  Administered 2019-07-25 – 2019-07-26 (×2): 17 g via ORAL
  Filled 2019-07-25 (×2): qty 1

## 2019-07-25 MED ORDER — HYDROMORPHONE HCL 1 MG/ML IJ SOLN
1.0000 mg | INTRAMUSCULAR | Status: DC | PRN
  Administered 2019-07-25 (×2): 1 mg via INTRAVENOUS
  Filled 2019-07-25 (×2): qty 1

## 2019-07-25 MED ORDER — DAKINS (1/4 STRENGTH) 0.125 % EX SOLN
Freq: Every day | CUTANEOUS | Status: DC
  Administered 2019-07-25: 17:00:00 via TOPICAL
  Filled 2019-07-25: qty 473

## 2019-07-25 NOTE — Progress Notes (Signed)
[facility]/Trauma Progress Note  13 Days Post-Op   Assessment/Plan History of Crohn's: On Humira, refer to GI HTN: Home meds  Intra-abdominal mass with SBO - S/p ex lap with removal of intra-abdominal mass, ileocecectomy, sigmoid colectomy with left end colostomy, partial cystectomy with repair of bladder, Dr. Carolynne Edouardoth, 8/26, POD #13 - Pathology benign, showed inflammation and small bowel ischemic changes - CT scan 09/04 showed ileus - Patient overall appears to be doing well postop  FEN: Heart healthy VTE: SCD's, lovenox ID: Zosyn 8/30-09/07 Vancomycin 9/1-9/7 per infectious disease Foley: None Follow up: Dr. Carolynne Edouardoth  DISPO: Ileus appears to be resolving, patient is tolerating his diet and having bowel function.  Pain control still seems to be an issue as he is taking copious Dilaudid.  PT recommended no follow-up.  Changed bowel regiment to MiraLAX and Colace.  If patient continues to improve he may be ready for discharge as early as tomorrow.    LOS: 14 days    Subjective: CC: Abdominal pain  Patient states he had some firm stool from his stoma this morning.  He thinks it this kind of "blocked him up".  He is feeling better.  He does not feel distended.  He has no nausea vomiting and he is tolerating his diet.  He has been ambulating.  Objective: Vital signs in last 24 hours: Temp:  [98.5 F (36.9 C)-99.6 F (37.6 C)] 99.6 F (37.6 C) (09/08 0524) Pulse Rate:  [76-90] 86 (09/08 0524) Resp:  [16-18] 17 (09/08 0524) BP: (120-130)/(71-75) 120/74 (09/08 0524) SpO2:  [96 %-100 %] 96 % (09/08 0524) Last BM Date: 07/23/19  Intake/Output from previous day: 09/07 0701 - 09/08 0700 In: 2218.7 [P.O.:1440; I.V.:696.3; IV Piggyback:82.4] Out: 1565 [Urine:1525; Drains:20; Stool:20] Intake/Output this shift: Total I/O In: 120 [P.O.:120] Out: 0   PE: Gen:  Alert, NAD, pleasant, cooperative Card:  RRR, no M/G/R heard Pulm:  CTA, no W/R/R, rate and effort normal Abd:  Soft, mild distention, + BS, drain with minimal serosanguineous drainage, midline wound with foul odor but no purulent drainage and some exudate at base, stoma is pink.  Formed tan stool and small amount of gas in bag.  Mild TTP around midline wound. Extremities: No TTP or swelling to calves bilaterally Skin: no rashes noted, warm and dry   Anti-infectives: Anti-infectives (From admission, onward)   Start     Dose/Rate Route Frequency Ordered Stop   07/18/19 1100  vancomycin (VANCOCIN) 1,500 mg in sodium chloride 0.9 % 500 mL IVPB     1,500 mg 250 mL/hr over 120 Minutes Intravenous Every 12 hours 07/18/19 1041 07/24/19 0355   07/16/19 1530  piperacillin-tazobactam (ZOSYN) IVPB 3.375 g     3.375 g 12.5 mL/hr over 240 Minutes Intravenous Every 8 hours 07/16/19 1502 07/24/19 1008   07/12/19 1018  ceFAZolin (ANCEF) 2-4 GM/100ML-% IVPB    Note to Pharmacy: Viviano Simasobinson, Lauren   : cabinet override      07/12/19 1018 07/12/19 2229      Lab Results:  Recent Labs    07/24/19 0437  WBC 13.8*  HGB 11.4*  HCT 35.5*  PLT 545*   BMET Recent Labs    07/23/19 0407 07/24/19 0437  NA 137 136  K 3.7 4.0  CL 103 103  CO2 26 25  GLUCOSE 114* 111*  BUN 6 11  CREATININE 0.74 0.81  CALCIUM 8.6* 8.7*   PT/INR No results for input(s): LABPROT, INR in the last 72 hours. CMP  Component Value Date/Time   NA 136 07/24/2019 0437   K 4.0 07/24/2019 0437   CL 103 07/24/2019 0437   CO2 25 07/24/2019 0437   GLUCOSE 111 (H) 07/24/2019 0437   BUN 11 07/24/2019 0437   CREATININE 0.81 07/24/2019 0437   CALCIUM 8.7 (L) 07/24/2019 0437   PROT 6.9 07/24/2019 0437   ALBUMIN 2.4 (L) 07/24/2019 0437   AST 21 07/24/2019 0437   ALT 37 07/24/2019 0437   ALKPHOS 112 07/24/2019 0437   BILITOT 0.3 07/24/2019 0437   GFRNONAA >60 07/24/2019 0437   GFRAA >60 07/24/2019 0437   Lipase     Component Value Date/Time   LIPASE 28 07/11/2019 0119    Studies/Results: No results found.    Kalman Drape , [facility] 07/25/2019, 10:18 AM  Pager: [phone] Mon-Wed, Friday 7:00am-4:30pm Thurs 7am-11:30am  Consults: [phone]

## 2019-07-25 NOTE — Progress Notes (Signed)
PT Cancellation Note  Patient Details Name: Whitman Meinhardt MRN: [id number] DOB: February 05, 1971   Cancelled Treatment:    Reason Eval/Treat Not Completed: Other (comment)(pt reports he's walking 1200'  TID in hallway without difficulty. No PT indicated, will sign off. Encouraged pt to continue ambulation in halls.)   Philomena Doheny PT 07/25/2019  Acute Rehabilitation Services Pager [phone] Office [phone]

## 2019-07-26 LAB — GLUCOSE, CAPILLARY
Glucose-Capillary: 93 mg/dL (ref 70–99)
Glucose-Capillary: 106 mg/dL — ABNORMAL HIGH (ref 70–99)
Glucose-Capillary: 97 mg/dL (ref 70–99)

## 2019-07-26 LAB — CBC
HCT: 33.7 % — ABNORMAL LOW (ref 39.0–52.0)
Hemoglobin: 10.7 g/dL — ABNORMAL LOW (ref 13.0–17.0)
MCH: 27.1 pg (ref 26.0–34.0)
MCHC: 31.8 g/dL (ref 30.0–36.0)
MCV: 85.3 fL (ref 80.0–100.0)
Platelets: 642 10*3/uL — ABNORMAL HIGH (ref 150–400)
RBC: 3.95 MIL/uL — ABNORMAL LOW (ref 4.22–5.81)
RDW: 13.5 % (ref 11.5–15.5)
WBC: 10.1 10*3/uL (ref 4.0–10.5)
nRBC: 0 % (ref 0.0–0.2)

## 2019-07-26 MED ORDER — OXYCODONE HCL 5 MG PO TABS: 5.0000 mg | ORAL_TABLET | Freq: Four times a day (QID) | ORAL | 0 refills | Status: DC | PRN

## 2019-07-26 MED ORDER — POLYETHYLENE GLYCOL 3350 17 G PO PACK: 17.0000 g | PACK | Freq: Every day | ORAL | 0 refills | Status: AC

## 2019-07-26 MED ORDER — DOCUSATE SODIUM 100 MG PO CAPS: 100.0000 mg | ORAL_CAPSULE | Freq: Two times a day (BID) | ORAL | 0 refills | Status: AC

## 2019-07-26 MED ORDER — OXYCODONE HCL 5 MG PO TABS: 5.0000 mg | ORAL_TABLET | Freq: Four times a day (QID) | ORAL | 0 refills | Status: AC | PRN
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
## 2019-07-26 NOTE — Discharge Instructions (Signed)
[facility]      [facility], [place] [phone]  OPEN ABDOMINAL SURGERY: POST OP INSTRUCTIONS  Always review your discharge instruction sheet given to you by the facility where your surgery was performed.  IF YOU HAVE DISABILITY OR FAMILY LEAVE FORMS, YOU MUST BRING THEM TO THE OFFICE FOR PROCESSING.  PLEASE DO NOT GIVE THEM TO YOUR DOCTOR.  1. A prescription for pain medication may be given to you upon discharge.  Take your pain medication as prescribed, if needed.  If narcotic pain medicine is not needed, then you may take acetaminophen (Tylenol) or ibuprofen (Advil) as needed. 2. Take your usually prescribed medications unless otherwise directed. 3. If you need a refill on your pain medication, please contact your pharmacy. They will contact our office to request authorization.  Prescriptions will not be filled after 5pm or on week-ends. 4. You should follow a light diet the first few days after arrival home, such as soup and crackers, pudding, etc.unless your doctor has advised otherwise. A high-fiber, low fat diet can be resumed as tolerated.   Be sure to include lots of fluids daily. Most patients will experience some swelling and bruising on the chest and neck area.  Ice packs will help.  Swelling and bruising can take several days to resolve 5. Most patients will experience some swelling and bruising in the area of the incision. Ice pack will help. Swelling and bruising can take several days to resolve..  6. It is common to experience some constipation if taking pain medication after surgery.  Increasing fluid intake and taking a stool softener will usually help or prevent this problem from occurring.  A mild laxative (Milk of Magnesia or Miralax) should be taken according to package directions if there are no bowel movements after 48 hours. 7.  You may have steri-strips (small skin tapes) in place directly over the incision.  These strips should be left on the skin for 7-10 days.  If your  surgeon used skin glue on the incision, you may shower in 24 hours.  The glue will flake off over the next 2-3 weeks.  Any sutures or staples will be removed at the office during your follow-up visit. You may find that a light gauze bandage over your incision may keep your staples from being rubbed or pulled. You may shower and replace the bandage daily. 8. ACTIVITIES:  You may resume regular (light) daily activities beginning the next day--such as daily self-care, walking, climbing stairs--gradually increasing activities as tolerated.  You may have sexual intercourse when it is comfortable.  Refrain from any heavy lifting or straining until approved by your doctor. a. You may drive when you no longer are taking prescription pain medication, you can comfortably wear a seatbelt, and you can safely maneuver your car and apply brakes b. Return to Work: no heavy lifting (>15lbs) until approved by your doctor 9. You should see your doctor in the office for a follow-up appointment approximately two weeks after your surgery.  Make sure that you call for this appointment within a day or two after you arrive home to insure a convenient appointment time. OTHER INSTRUCTIONS:  _____________________________________________________________ _____________________________________________________________  WHEN TO CALL YOUR DOCTOR: 1. Fever over 101.0 2. Inability to urinate 3. Nausea and/or vomiting 4. Extreme swelling or bruising 5. Continued bleeding from incision. 6. Increased pain, redness, or drainage from the incision. 7. Difficulty swallowing or breathing 8. Muscle cramping or spasms. 9. Numbness or tingling in hands or feet or around  lips.  The clinic staff is available to answer your questions during regular business hours.  Please dont hesitate to call and ask to speak to one of the nurses if you have concerns.  For further questions, please visit [email or web address]   MIDLINE WOUND  CARE: - midline dressing to be changed twice daily - supplies: sterile saline, kerlix, scissors, ABD pads, tape  - remove dressing and all packing carefully, moistening with sterile saline as needed to avoid packing/internal dressing sticking to the wound. - clean edges of skin around the wound with water/gauze, making sure there is no tape debris or leakage left on skin that could cause skin irritation or breakdown. - dampen and clean kerlix with sterile saline and pack wound from wound base to skin level, making sure to take note of any possible areas of wound tracking, tunneling and packing appropriately. Wound can be packed loosely. Trim kerlix to size if a whole kerlix is not required. - cover wound with a dry ABD pad and secure with tape.  - write the date/time on the dry dressing/tape to better track when the last dressing change occurred. - apply any skin protectant/powder recommended by clinician to protect skin/skin folds. - change dressing as needed if leakage occurs, wound gets contaminated, or patient requests to shower. - patient may shower daily with wound open and following the shower the wound should be dried and a clean dressing placed.     Colostomy Home Guide, Adult  Colostomy surgery is done to create an opening in the front of the abdomen for stool (feces) to leave the body through an ostomy (stoma). Part of the large intestine is attached to the stoma. A bag, also called a pouch, is fitted over the stoma. Stool and gas will collect in the bag. After surgery, you will need to empty and change your colostomy bag as needed. You will also need to care for your stoma. How to care for the stoma Your stoma should look pink, red, and moist, like the inside of your cheek. Soon after surgery, the stoma may be swollen, but this swelling will go away within 6 weeks. To care for the stoma:  Keep the skin around the stoma clean and dry.  Use a clean, soft washcloth to gently wash the  stoma and the skin around it. Clean using a circular motion, and wipe away from the stoma opening, not toward it. ? Use warm water and only use cleansers recommended by your health care provider. ? Rinse the stoma area with plain water. ? Dry the area around the stoma well.  Use stoma powder or ointment on your skin only as told by your health care provider. Do not use any other powders, gels, wipes, or creams on the skin around the stoma.  Check the stoma area every day for signs of infection. Check for: ? New or worsening redness, swelling, or pain. ? New or increased fluid or blood. ? Pus or warmth.  Measure the stoma opening regularly and record the size. Watch for changes. (It is normal for the stoma to get smaller as swelling goes away.) Share this information with your health care provider. How to empty the colostomy bag  Empty your bag at bedtime and whenever it is one-third to one-half full. Do not let the bag get more than half-full with stool or gas. The bag could leak if it gets too full. Some colostomy bags have a built-in gas release valve that releases gas often throughout the  day. Follow these basic steps: 1. Wash your hands with soap and water. 2. Sit far back on the toilet seat. 3. Put several pieces of toilet paper into the toilet water. This will prevent splashing as you empty stool into the toilet. 4. Remove the clip or the hook-and-loop fastener from the tail end of the bag. 5. Unroll the tail, then empty the stool into the toilet. 6. Clean the tail with toilet paper or a moist towelette. 7. Reroll the tail, and close it with the clip or the hook-and-loop fastener. 8. Wash your hands again. How to change the colostomy bag Change your bag every 3-4 days or as often as told by your health care provider. Also change the bag if it is leaking or separating from the skin, or if your skin around the stoma looks or feels irritated. Irritated skin may be a sign that the bag is  leaking. Always have colostomy supplies with you, and follow these basic steps: 1. Wash your hands with soap and water. Have paper towels or tissues nearby to clean any discharge. 2. Remove the old bag and skin barrier. Use your fingers or a warm cloth to gently push the skin away from the barrier. 3. Clean the stoma area with water or with mild soap and water, as directed. Use water to rinse away any soap. 4. Dry the skin. You may use the cool setting on a hair dryer to do this. 5. Use a tracing pattern (template) to cut the skin barrier to the size needed. 6. If you are using a two-piece bag, attach the bag and the skin barrier to each other. Add the barrier ring, if you use one. 7. If directed, apply stoma powder or skin barrier gel to the skin. 8. Warm the skin barrier with your hands, or blow with a hair dryer for 5-10 seconds. 9. Remove the paper from the adhesive strip of the skin barrier. 10. Press the adhesive strip onto the skin around the stoma. 11. Gently rub the skin barrier onto the skin. This creates heat that helps the barrier to stick. 12. Apply stoma tape to the edges of the skin barrier, if desired. 82. Wash your hands again. General recommendations  Avoid wearing tight clothes or having anything press directly on your stoma or bag. Change your clothing whenever it is soiled or damp.  You may shower or bathe with the bag on or off. Do not use harsh or oily soaps or lotions. Dry the skin and bag after bathing.  Store all supplies in a cool, dry place. Do not leave supplies in extreme heat because some parts can melt or not stick as well.  Whenever you leave home, take extra clothing and an extra skin barrier and bag with you.  If your bag gets wet, you can dry it with a hair dryer on the cool setting.  To prevent odor, you may put drops of ostomy deodorizer in the bag.  If recommended by your health care provider, put ostomy lubricant inside the bag. This helps stool to  slide out of the bag more easily and completely. Contact a health care provider if:  You have new or worsening redness, swelling, or pain around your stoma.  You have new or increased fluid or blood coming from your stoma.  Your stoma feels warm to the touch.  You have pus coming from your stoma.  Your stoma extends in or out farther than normal.  You need to change your  bag every day.  You have a fever. Get help right away if:  Your stool is bloody.  You have nausea or you vomit.  You have trouble breathing. Summary  Measure your stoma opening regularly and record the size. Watch for changes.  Empty your bag at bedtime and whenever it is one-third to one-half full. Do not let the bag get more than half-full with stool or gas.  Change your bag every 3-4 days or as often as told by your health care provider.  Whenever you leave home, take extra clothing and an extra skin barrier and bag with you. This information is not intended to replace advice given to you by your health care provider. Make sure you discuss any questions you have with your health care provider. Document Released: 11/05/2003 Document Revised: 02/22/2019 Document Reviewed: 04/28/2017 Elsevier Patient Education  2020 [employer]

## 2019-07-26 NOTE — Progress Notes (Signed)
Discharge instructions given to patient. Patient has no questions. NT or writer will wheel patient out once family comes to pick him up

## 2019-07-26 NOTE — TOC Progression Note (Addendum)
Transition of Care [facility]) - Progression Note    Patient Details  Name: Evie Crumpler MRN: [id number] Date of Birth: 11-24-1970  [facility] CM/SW Contact  Leeroy Cha, RN Phone Number: 07/26/2019, 11:49 AM  Clinical Narrative:    Have called [facility] [place]-no answer at number given no the internet/[place] does not take medicaid.unable to set hhc for this patient due to no providers will take medicaid in his living area.PA is aware.        Expected Discharge Plan and Services           Expected Discharge Date: (unknown)                                     Social Determinants of Health (SDOH) Interventions    Readmission Risk Interventions No flowsheet data found.

## 2019-07-26 NOTE — Progress Notes (Signed)
[facility]/Trauma Progress Note  14 Days Post-Op   Assessment/Plan History of Crohn's: On Humira, refer to GI HTN: Home meds  Intra-abdominal mass with SBO - S/p ex lap with removal of intra-abdominal mass, ileocecectomy, sigmoid colectomy with left end colostomy, partial cystectomy with repair of bladder, Dr. Marlou Starks, 8/26, POD #13 - Pathology benign, showed inflammation and small bowel ischemic changes - CT scan 09/04 showed ileus, this seems to be resolving as patient had copious stool output yesterday - WBC is trending down CBC pending today - Midline wound with some separation/dehiscence.  No evisceration noted.  Continue twice daily wet-to-dry dressing changes  FEN: Heart healthy VTE: SCD's, lovenox ID: Zosyn 8/30-09/07 Vancomycin 9/1-9/7 per infectious disease Foley: None Follow up: Dr. Marlou Starks  DISPO: [facility] pending.  CBC pending to check WBC.  Possible discharge afternoon.   LOS: 15 days    Subjective: CC: Abdominal pain  Patient had copious stool output yesterday.  He is feeling much better.  He is having only minimal pain around his incision.  Patient dates he lives in [place] and he would like to follow-up with a surgeon down there for his postop follow-up and his colostomy reversal.  Patient states he lives with his father.  Patient will do his own wound care.  Objective: Vital signs in last 24 hours: Temp:  [98.2 F (36.8 C)-99 F (37.2 C)] 98.8 F (37.1 C) (09/09 0607) Pulse Rate:  [75-86] 75 (09/09 0607) Resp:  [17-19] 17 (09/09 0607) BP: (126-131)/(74-87) 131/74 (09/09 0607) SpO2:  [99 %-100 %] 100 % (09/09 0607) Last BM Date: 07/23/19  Intake/Output from previous day: 09/08 0701 - 09/09 0700 In: 1072.5 [P.O.:840; I.V.:232.5] Out: 1093 [Urine:2600; Drains:15; ATFTD:3220] Intake/Output this shift: No intake/output data recorded.  PE: Gen:  Alert, NAD, pleasant, cooperative Card:  RRR, no M/G/R heard Pulm:  CTA, no W/R/R, rate and  effort normal Abd: Soft, mild distention, + BS, drain with minimal serosanguineous drainage, midline wound with foul odor but no purulent drainage and some exudate at base, (see photo below ) stoma is pink.  Formed tan stool and small amount of gas in bag.  Mild TTP around midline wound. Extremities: No TTP or swelling to calves bilaterally Skin: no rashes noted, warm and dry      Anti-infectives: Anti-infectives (From admission, onward)   Start     Dose/Rate Route Frequency Ordered Stop   07/18/19 1100  vancomycin (VANCOCIN) 1,500 mg in sodium chloride 0.9 % 500 mL IVPB     1,500 mg 250 mL/hr over 120 Minutes Intravenous Every 12 hours 07/18/19 1041 07/24/19 0355   07/16/19 1530  piperacillin-tazobactam (ZOSYN) IVPB 3.375 g     3.375 g 12.5 mL/hr over 240 Minutes Intravenous Every 8 hours 07/16/19 1502 07/24/19 1008   07/12/19 1018  ceFAZolin (ANCEF) 2-4 GM/100ML-% IVPB    Note to Pharmacy: Charmayne Sheer   : cabinet override      07/12/19 1018 07/12/19 2229      Lab Results:  Recent Labs    07/24/19 0437 07/25/19 1044  WBC 13.8* 12.9*  HGB 11.4* 11.3*  HCT 35.5* 35.8*  PLT 545* 585*   BMET Recent Labs    07/24/19 0437  NA 136  K 4.0  CL 103  CO2 25  GLUCOSE 111*  BUN 11  CREATININE 0.81  CALCIUM 8.7*   PT/INR No results for input(s): LABPROT, INR in the last 72 hours. CMP     Component Value Date/Time   NA  136 07/24/2019 0437   K 4.0 07/24/2019 0437   CL 103 07/24/2019 0437   CO2 25 07/24/2019 0437   GLUCOSE 111 (H) 07/24/2019 0437   BUN 11 07/24/2019 0437   CREATININE 0.81 07/24/2019 0437   CALCIUM 8.7 (L) 07/24/2019 0437   PROT 6.9 07/24/2019 0437   ALBUMIN 2.4 (L) 07/24/2019 0437   AST 21 07/24/2019 0437   ALT 37 07/24/2019 0437   ALKPHOS 112 07/24/2019 0437   BILITOT 0.3 07/24/2019 0437   GFRNONAA >60 07/24/2019 0437   GFRAA >60 07/24/2019 0437   Lipase     Component Value Date/Time   LIPASE 28 07/11/2019 0119    Studies/Results: Dg  Abd Portable 1v  Result Date: 07/25/2019 CLINICAL DATA:  Ileus.  Recent abdominal surgery. EXAM: PORTABLE ABDOMEN - 1 VIEW COMPARISON:  CT abdomen pelvis dated July 21, 2019. Abdominal x-ray dated July 15, 2019. FINDINGS: Diffuse gaseous distention of small bowel is unchanged. Unchanged surgical drain in the pelvis. No acute osseous abnormality. IMPRESSION: 1. Unchanged small bowel ileus. Electronically Signed   By: Obie Dredge M.D.   On: 07/25/2019 16:43      Jerre Simon , [facility] 07/26/2019, 9:37 AM  Pager: [phone] Mon-Wed, Friday 7:00am-4:30pm Thurs 7am-11:30am  Consults: [phone]

## 2019-09-11 ENCOUNTER — Ambulatory Visit: Payer: Self-pay | Admitting: General Surgery

## 2019-10-25 NOTE — Progress Notes (Addendum)
[facility] #[id number] [place], [place] - [address] AT [facility] [address] & [facility] [address] [place] [address] Phone: [phone] Fax: [phone]  [facility] #[id number] [place], [place] - [address] AT [facility] OF [facility] & [place] [address] [place] [address] Phone: [phone] Fax: [phone]      Your procedure is scheduled on December 14  Report to [facility] Main Entrance "A" at 0900 A.M., and check in at the Admitting office.  Call this number if you have problems the morning of surgery:  [phone]  Call [phone] if you have any questions prior to your surgery date Monday-Friday 8am-4pm    Remember:  Do not eat after midnight the night before your surgery  You may drink clear liquids until 0800 am the morning of your surgery.   Clear liquids allowed are: Water, Non-Citrus Juices (without pulp), Carbonated Beverages, Clear Tea, Black Coffee Only, and Gatorade  DRINK 2 PRE-SURGERY ENSURES THE NIGHT BEFORE SURGERY AND ONE THE MORNING OF SURGERY BEFORE 8 AM    Take these medicines the morning of surgery with A SIP OF WATER  amLODipine (NORVASC)  Eye drops if needed  7 days prior to surgery STOP taking any Aspirin (unless otherwise instructed by your surgeon), Aleve, Naproxen, Ibuprofen, Motrin, Advil, Goody's, BC's, all herbal medications, fish oil, and all vitamins.  FOLLOW 1 DAY BOWEL PREP INSTRUCTIONS   The Morning of Surgery  Do not wear jewelry  Do not wear lotions, powders, or colognes, or deodorant  Men may shave face and neck.  Do not bring valuables to the hospital.  [facility] is not responsible for any belongings or valuables.  If you are a smoker, DO NOT Smoke 24 hours prior to surgery  If you wear a CPAP at night please bring your mask, tubing, and machine the morning of surgery   Remember that you must have someone to transport you home after your surgery, and remain  with you for 24 hours if you are discharged the same day.   Please bring cases for contacts, glasses, hearing aids, dentures or bridgework because it cannot be worn into surgery.    Leave your suitcase in the car.  After surgery it may be brought to your room.  For patients admitted to the hospital, discharge time will be determined by your treatment team.  Patients discharged the day of surgery will not be allowed to drive home.    Special instructions:   [facility]- Preparing For Surgery  Before surgery, you can play an important role. Because skin is not sterile, your skin needs to be as free of germs as possible. You can reduce the number of germs on your skin by washing with CHG (chlorahexidine gluconate) Soap before surgery.  CHG is an antiseptic cleaner which kills germs and bonds with the skin to continue killing germs even after washing.    Oral Hygiene is also important to reduce your risk of infection.  Remember - BRUSH YOUR TEETH THE MORNING OF SURGERY WITH YOUR REGULAR TOOTHPASTE  Please do not use if you have an allergy to CHG or antibacterial soaps. If your skin becomes reddened/irritated stop using the CHG.  Do not shave (including legs and underarms) for at least 48 hours prior to first CHG shower. It is OK to shave your face.  Please follow these instructions carefully.   1. Shower the NIGHT BEFORE SURGERY and the MORNING OF  SURGERY with CHG Soap.   2. If you chose to wash your hair, wash your hair first as usual with your normal shampoo.  3. After you shampoo, rinse your hair and body thoroughly to remove the shampoo.  4. Use CHG as you would any other liquid soap. You can apply CHG directly to the skin and wash gently with a scrungie or a clean washcloth.   5. Apply the CHG Soap to your body ONLY FROM THE NECK DOWN.  Do not use on open wounds or open sores. Avoid contact with your eyes, ears, mouth and genitals (private parts). Wash Face and genitals (private  parts)  with your normal soap.   6. Wash thoroughly, paying special attention to the area where your surgery will be performed.  7. Thoroughly rinse your body with warm water from the neck down.  8. DO NOT shower/wash with your normal soap after using and rinsing off the CHG Soap.  9. Pat yourself dry with a CLEAN TOWEL.  10. Wear CLEAN PAJAMAS to bed the night before surgery, wear comfortable clothes the morning of surgery  11. Place CLEAN SHEETS on your bed the night of your first shower and DO NOT SLEEP WITH PETS.    Day of Surgery:  Please shower the morning of surgery with the CHG soap Do not apply any deodorants/lotions. Please wear clean clothes to the hospital/surgery center.   Remember to brush your teeth WITH YOUR REGULAR TOOTHPASTE.   Please read over the following fact sheets that you were given.

## 2019-10-26 ENCOUNTER — Encounter (HOSPITAL_COMMUNITY)
Admission: RE | Admit: 2019-10-26 | Discharge: 2019-10-26 | Disposition: A | Discharge status: Home / Self Care | Payer: Medicaid - Out of State | Source: Ambulatory Visit | Attending: General Surgery | Admitting: General Surgery

## 2019-10-26 ENCOUNTER — Encounter (HOSPITAL_COMMUNITY): Payer: Self-pay

## 2019-10-26 ENCOUNTER — Other Ambulatory Visit: Payer: Self-pay

## 2019-10-26 ENCOUNTER — Other Ambulatory Visit (HOSPITAL_COMMUNITY)
Admission: RE | Admit: 2019-10-26 | Discharge: 2019-10-26 | Disposition: A | Discharge status: Home / Self Care | Payer: Medicaid - Out of State | Source: Ambulatory Visit | Attending: General Surgery | Admitting: General Surgery

## 2019-10-26 DIAGNOSIS — Z20828 Contact with and (suspected) exposure to other viral communicable diseases: Secondary | ICD-10-CM | POA: Insufficient documentation

## 2019-10-26 DIAGNOSIS — Z01818 Encounter for other preprocedural examination: Secondary | ICD-10-CM | POA: Diagnosis not present

## 2019-10-26 HISTORY — DX: Anxiety disorder, unspecified: F41.9

## 2019-10-26 HISTORY — DX: Insomnia, unspecified: G47.00

## 2019-10-26 HISTORY — DX: Essential (primary) hypertension: I10

## 2019-10-26 LAB — CBC
HCT: 50 % (ref 39.0–52.0)
Hemoglobin: 16.8 g/dL (ref 13.0–17.0)
MCH: 27.5 pg (ref 26.0–34.0)
MCHC: 33.6 g/dL (ref 30.0–36.0)
MCV: 81.8 fL (ref 80.0–100.0)
Platelets: 251 10*3/uL (ref 150–400)
RBC: 6.11 MIL/uL — ABNORMAL HIGH (ref 4.22–5.81)
RDW: 13.6 % (ref 11.5–15.5)
WBC: 5.5 10*3/uL (ref 4.0–10.5)
nRBC: 0 % (ref 0.0–0.2)

## 2019-10-26 LAB — BASIC METABOLIC PANEL
Anion gap: 10 (ref 5–15)
BUN: 9 mg/dL (ref 6–20)
CO2: 24 mmol/L (ref 22–32)
Calcium: 9.4 mg/dL (ref 8.9–10.3)
Chloride: 104 mmol/L (ref 98–111)
Creatinine, Ser: 0.9 mg/dL (ref 0.61–1.24)
GFR calc Af Amer: >60 mL/min (ref >60)
GFR calc non Af Amer: >60 mL/min (ref >60)
Glucose, Bld: 100 mg/dL — ABNORMAL HIGH (ref 70–99)
Potassium: 4.3 mmol/L (ref 3.5–5.1)
Sodium: 138 mmol/L (ref 135–145)

## 2019-10-26 NOTE — Progress Notes (Signed)
PCP - Doesn't remember MD name but it's with [facility] in [facility]--Dr. Cherlyn Cushing  PPM/ICD - na Device Orders -  Rep Notified -   Chest x-ray - na EKG - today Stress Test - na ECHO - na Cardiac Cath - na  Sleep Study - na CPAP -   Fasting Blood Sugar - na Checks Blood Sugar _____ times a day  Blood Thinner Instructions:na Aspirin Instructions:  ERAS Protcol -yes PRE-SURGERY Ensure : drink 2 bottles night before and 1 bottle day of surgery.  COVID TEST- 10/26/19   Anesthesia review:   Patient denies shortness of breath, fever, cough and chest pain at PAT appointment   All instructions explained to the patient, with a verbal understanding of the material. Patient agrees to go over the instructions while at home for a better understanding. Patient also instructed to self quarantine after being tested for COVID-19. The opportunity to ask questions was provided.

## 2019-10-27 LAB — NOVEL CORONAVIRUS, NAA (HOSP ORDER, SEND-OUT TO REF LAB; TAT 18-24 HRS): SARS-CoV-2, NAA: NOT DETECTED

## 2019-10-30 ENCOUNTER — Encounter (HOSPITAL_COMMUNITY): Admission: RE | Payer: Self-pay | Source: Home / Self Care

## 2019-10-30 ENCOUNTER — Inpatient Hospital Stay (HOSPITAL_COMMUNITY)
Admission: RE | Admit: 2019-10-30 | Payer: Medicaid - Out of State | Source: Home / Self Care | Admitting: General Surgery

## 2019-10-30 SURGERY — CLOSURE, COLOSTOMY, LAPAROSCOPIC
Anesthesia: General

## 2020-09-11 IMAGING — DX PORTABLE ABDOMEN - 1 VIEW
1 series · 1 of 1 positions shown · non-contrast
Comparison: None.

CLINICAL DATA: NG tube placement.

EXAM:
PORTABLE ABDOMEN - 1 VIEW

[abdomen kub]
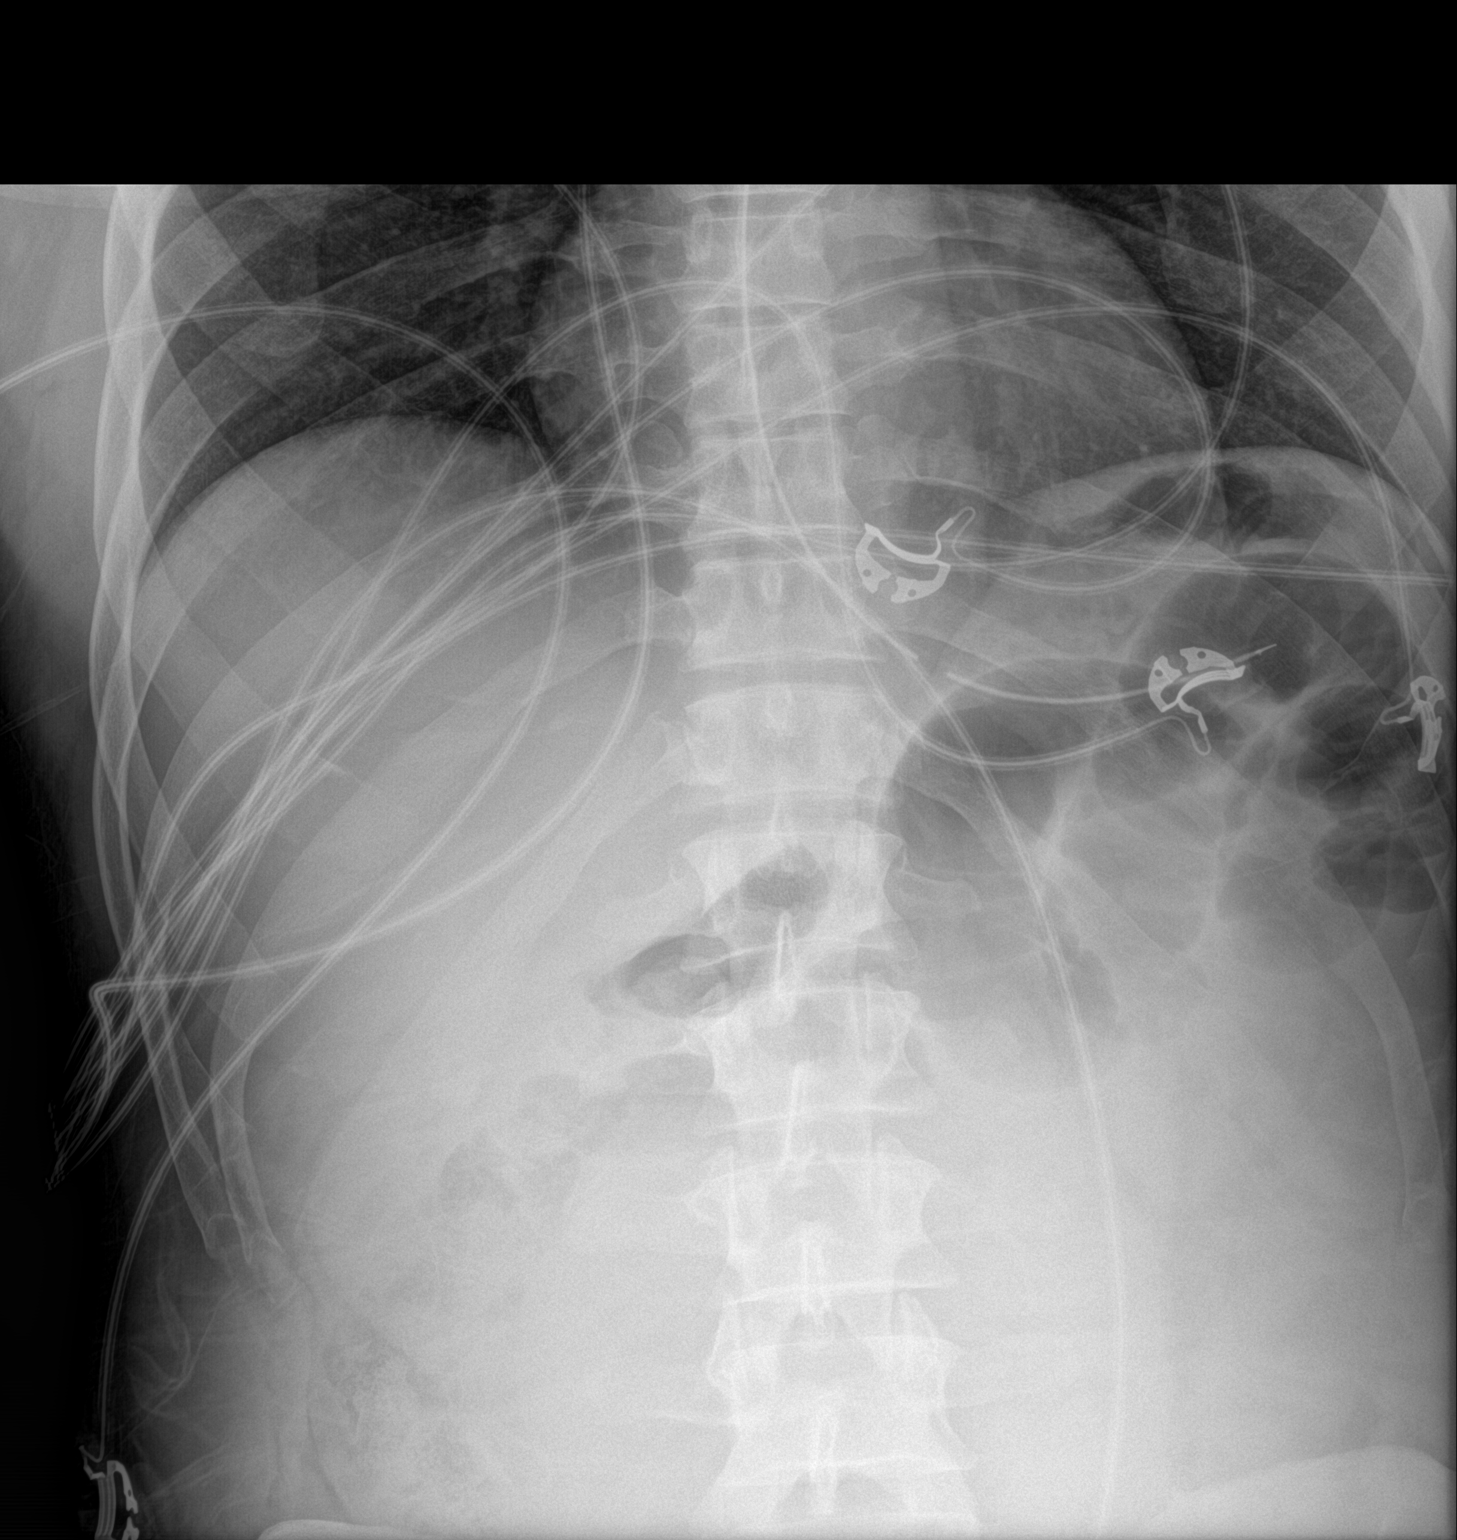

[1 of 1 positions shown; findings below may reference images not displayed]

FINDINGS: NG tube tip is in the fundus of the stomach. Dilated small bowel
loops are noted in the left upper quadrant.

Bones appear normal. Lung bases are clear.
IMPRESSION: NG tube tip in the fundus of the stomach. Dilated small bowel loops
in the left upper quadrant consistent with small bowel obstruction.

## 2020-09-15 IMAGING — DX PORTABLE ABDOMEN - 1 VIEW
2 series · 2 of 2 positions shown · non-contrast
Comparison: 07/15/2019

CLINICAL DATA: NG tube placement

EXAM:
PORTABLE ABDOMEN - 1 VIEW

[abdomen kub (1 of 2)]
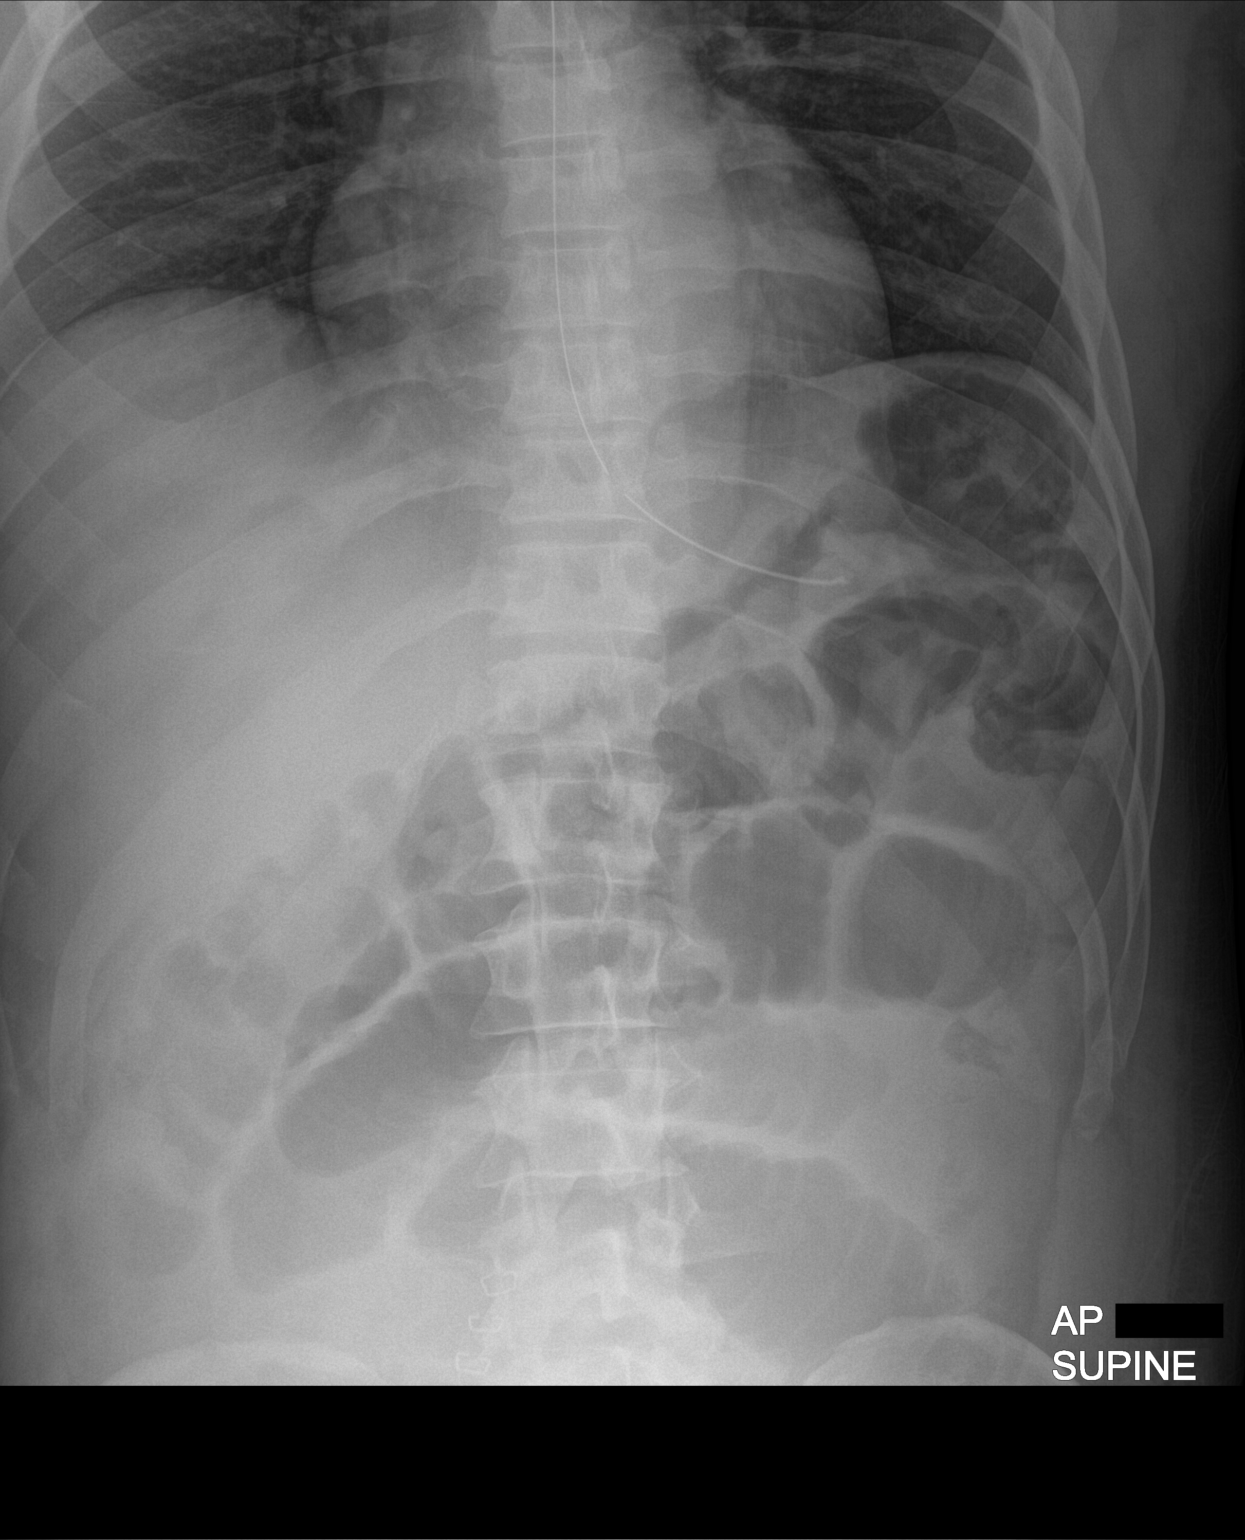

[abdomen kub (2 of 2)]
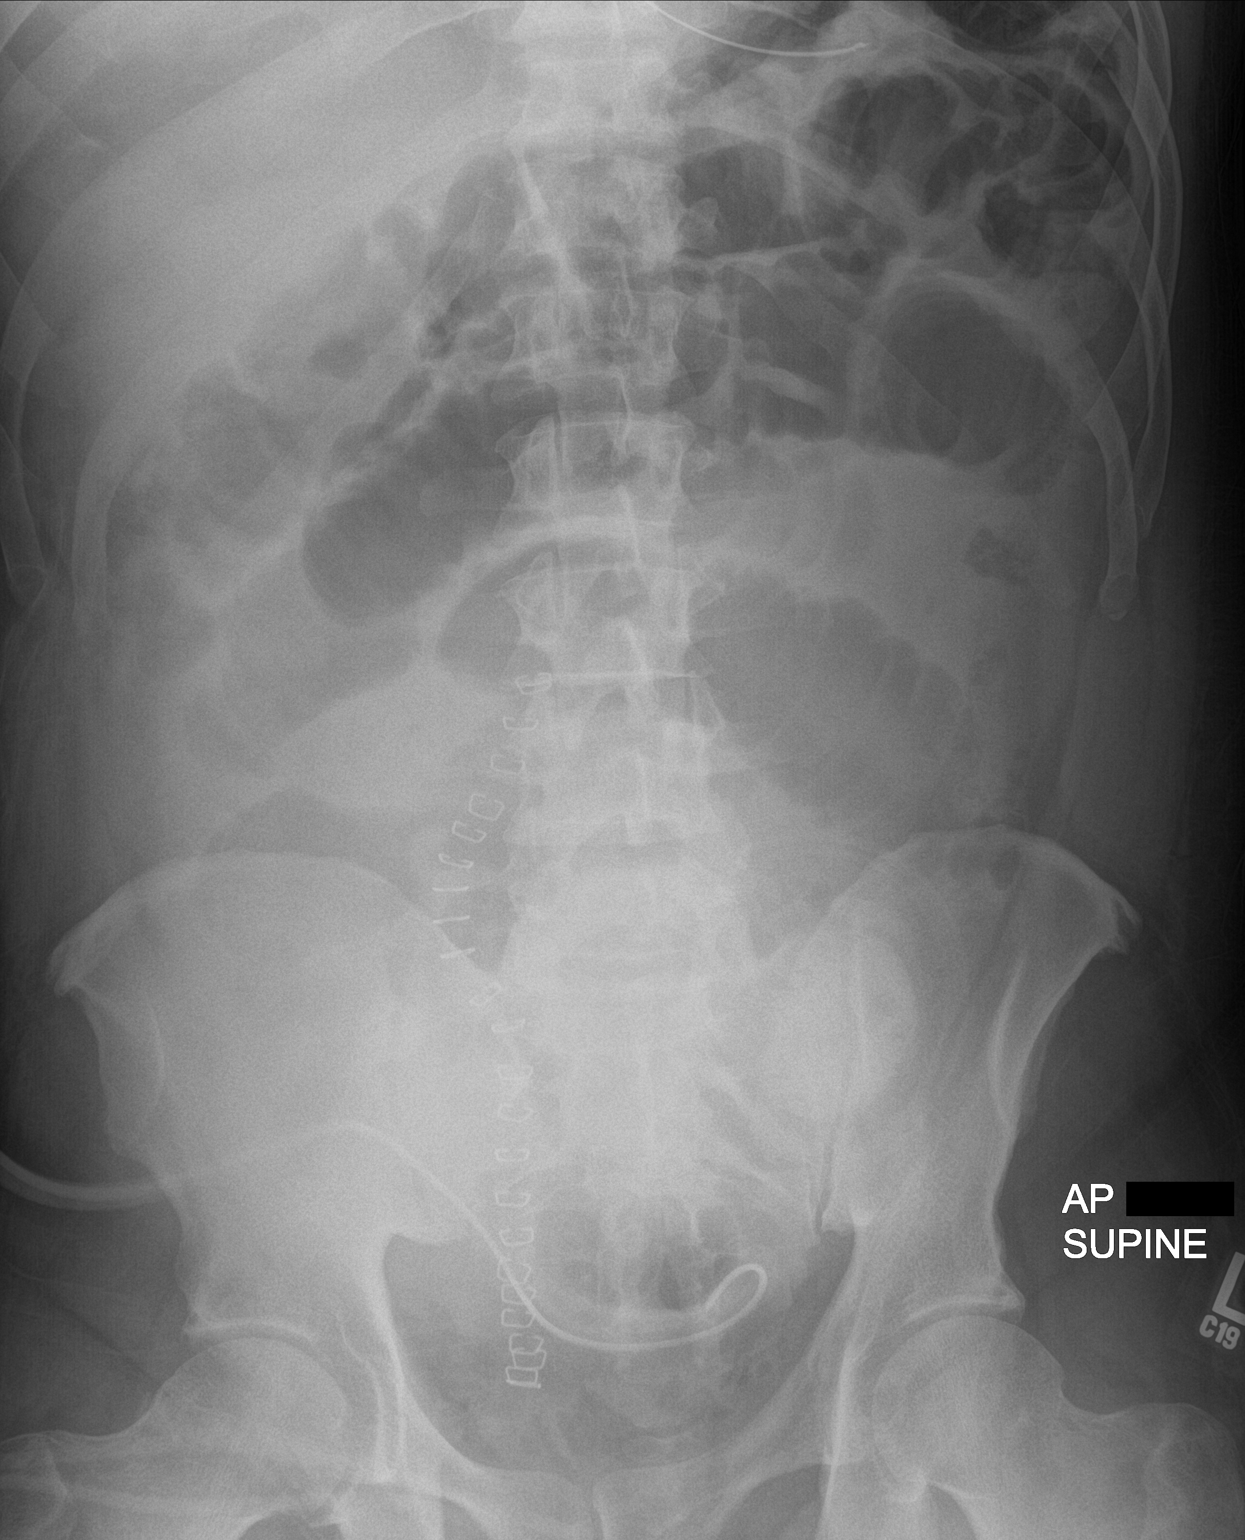

[2 of 2 positions shown; findings below may reference images not displayed]

FINDINGS: The tip of the enteric tube now projects over the expected region of
the gastric body/fundus. The tip is pointed distally. Again noted
are dilated loops of small bowel measuring up to approximately 5 cm
in diameter.
IMPRESSION: Enteric tube tip projects over the gastric body/fundus.

## 2020-09-15 IMAGING — DX ABDOMEN - 1 VIEW
1 series · 1 of 1 positions shown · non-contrast
Comparison: 07/11/2019

CLINICAL DATA: Nasogastric tube placement.

EXAM:
ABDOMEN - 1 VIEW

[abdomen kub]
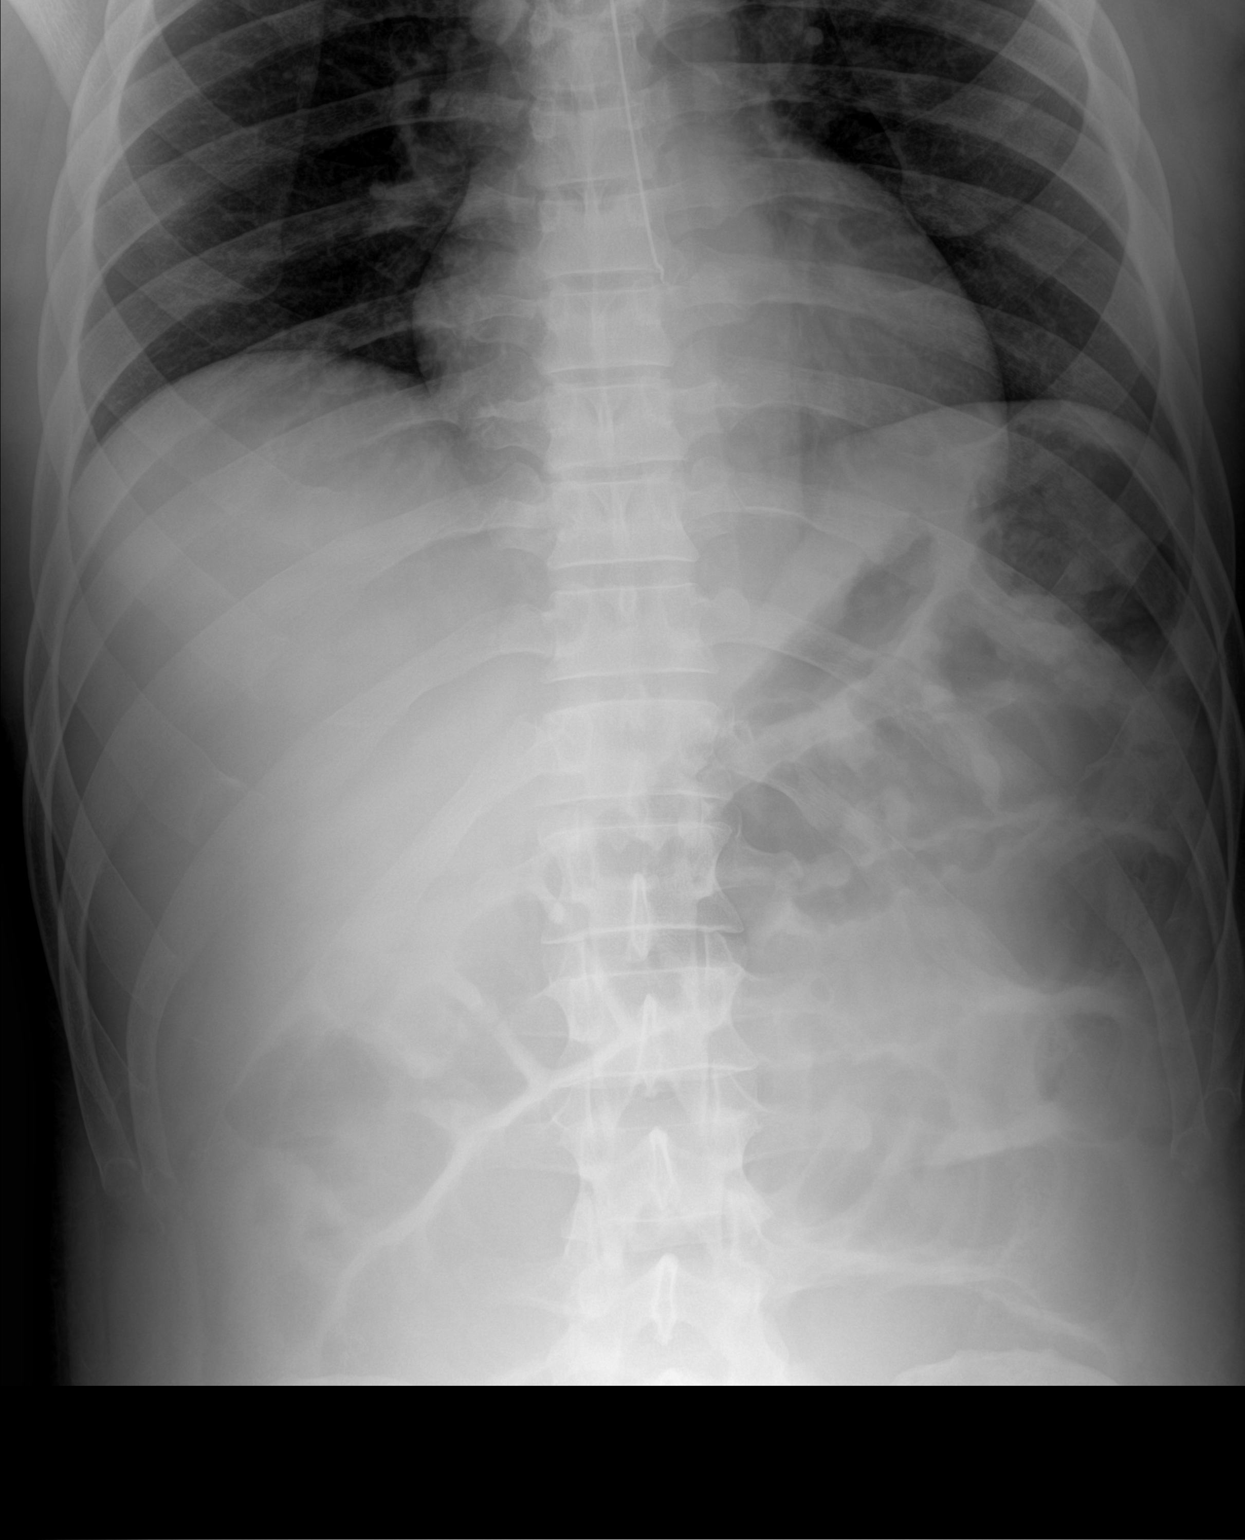

[1 of 1 positions shown; findings below may reference images not displayed]

FINDINGS: Nasogastric tube terminates in the lower esophagus. Mild right
hemidiaphragm elevation. No free intraperitoneal air.

Gaseous distension of small bowel loops up to 4.5 cm. Gas within
normal caliber colon. Low pelvis excluded.
IMPRESSION: Nasogastric tube terminating at the distal esophagus. Recommend
advancement.

Small bowel distension, favoring mild adynamic ileus.

These results will be called to the ordering clinician or
representative by the Radiologist Assistant, and communication
documented in the PACS or zVision Dashboard.

## 2020-09-21 IMAGING — CT CT ABD-PELV W/ CM
2 of 5 series · 15 of 46 positions shown, 17 images · IV contrast (OMNIPAQUE)
Comparison: None.

CLINICAL DATA: Exploratory laparotomy with removal of
intra-abdominal mass. Ileocecectomy, sigmoid colectomy, left and
colostomy and partial cystectomy 07/12/2019 increased abdominal
distension.

EXAM:
CT ABDOMEN AND PELVIS WITH CONTRAST
TECHNIQUE: Multidetector CT imaging of the abdomen and pelvis was performed
using the standard protocol following bolus administration of
intravenous contrast.
CONTRAST:  100mL OMNIPAQUE IOHEXOL 300 MG/ML SOLN, 30mL OMNIPAQUE
IOHEXOL 300 MG/ML SOLN

[Series 3: axial st · axial · 0.75mm/px · z∈[-398,+52]mm · 12 of 104 slices shown, 14 images]
[im 7/104  soft-tissue]
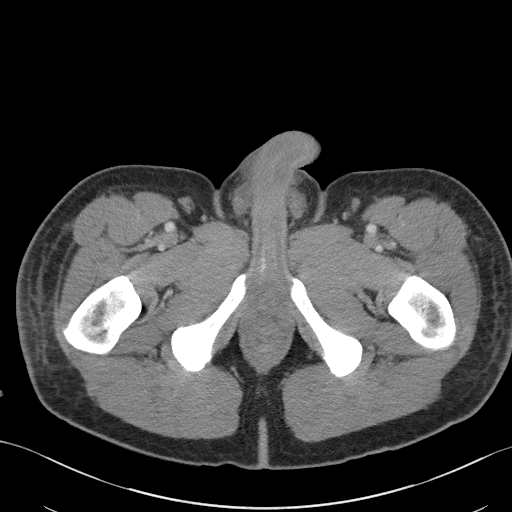
[im 7/104  bone]
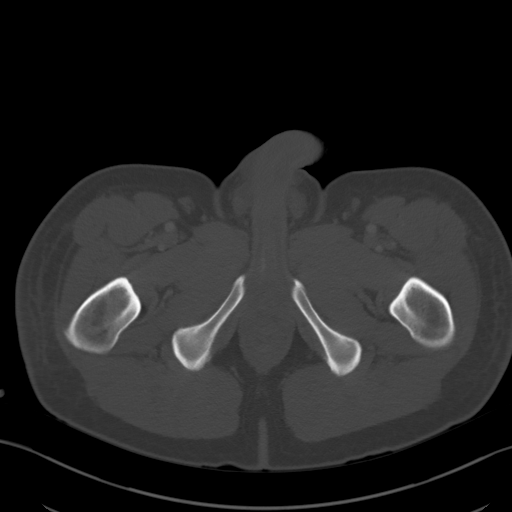
[im 14/104  soft-tissue]
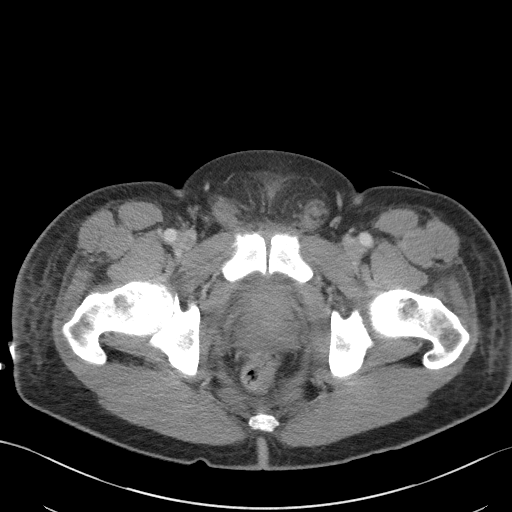
[im 21/104  soft-tissue]
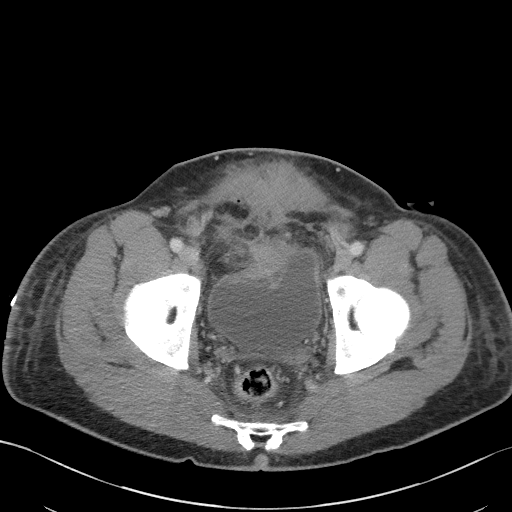
[im 35/104  soft-tissue]
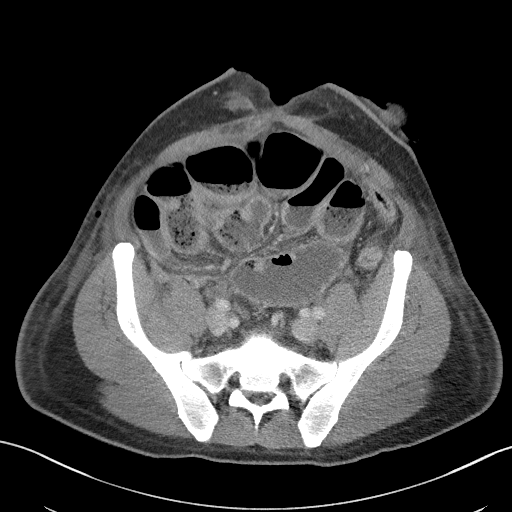
[im 42/104  soft-tissue]
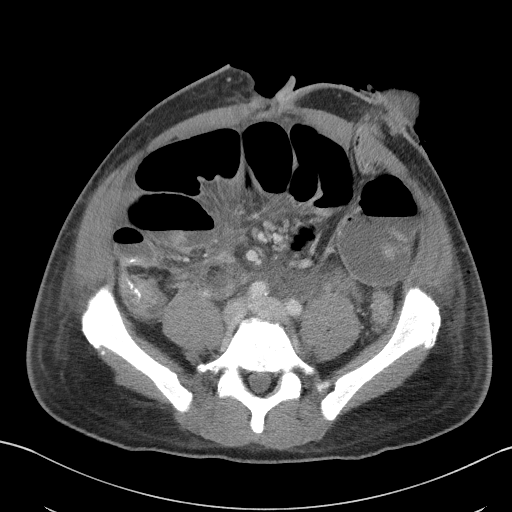
[im 49/104  soft-tissue]
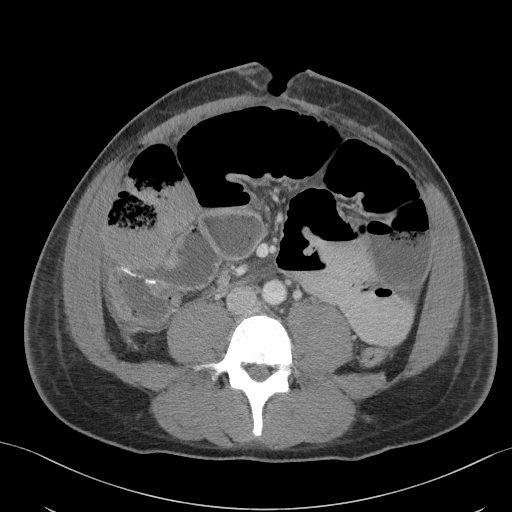
[im 55/104  soft-tissue]
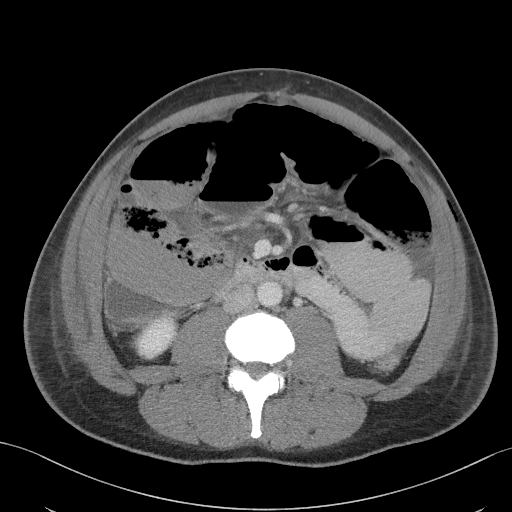
[im 62/104  soft-tissue]
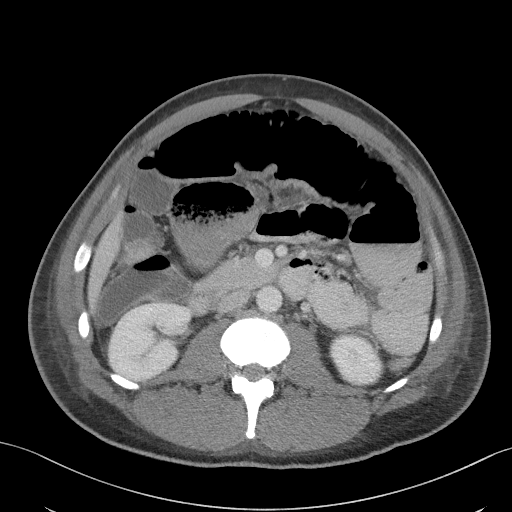
[im 69/104  soft-tissue]
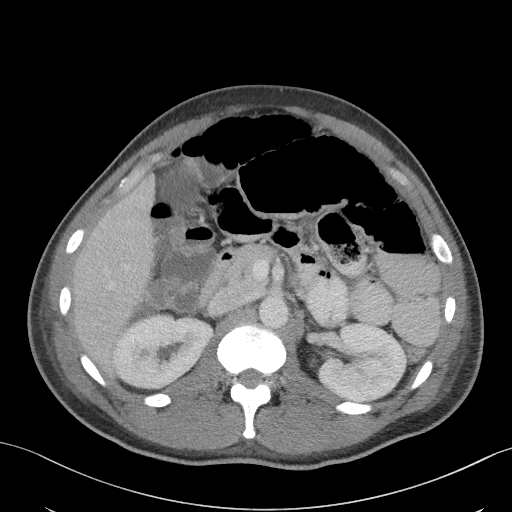
[im 69/104  bone]
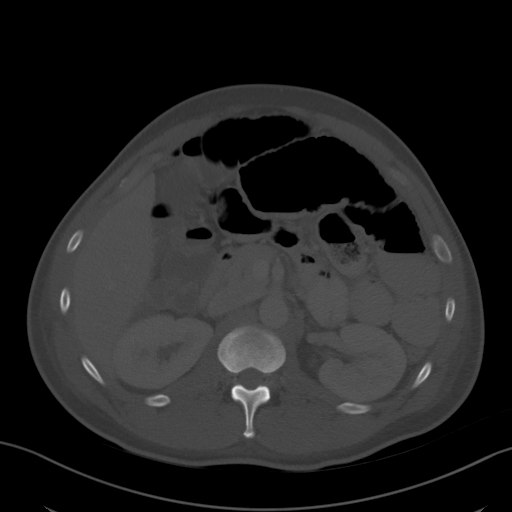
[im 83/104  soft-tissue]
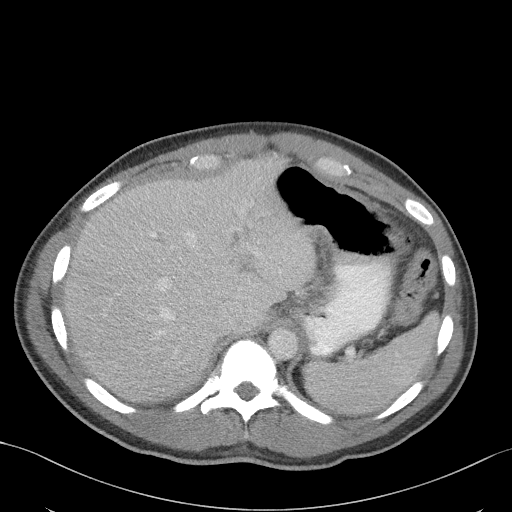
[im 90/104  soft-tissue]
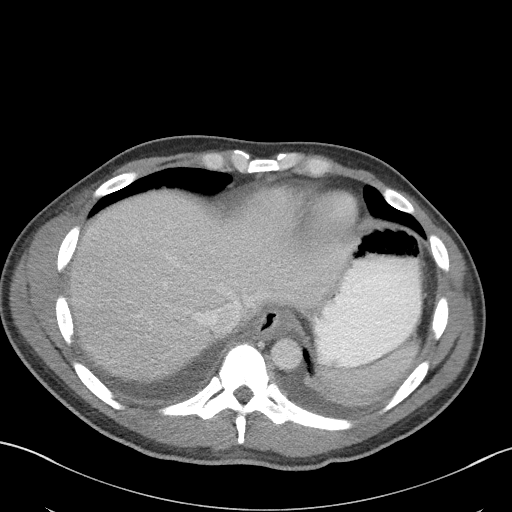
[im 97/104  soft-tissue]
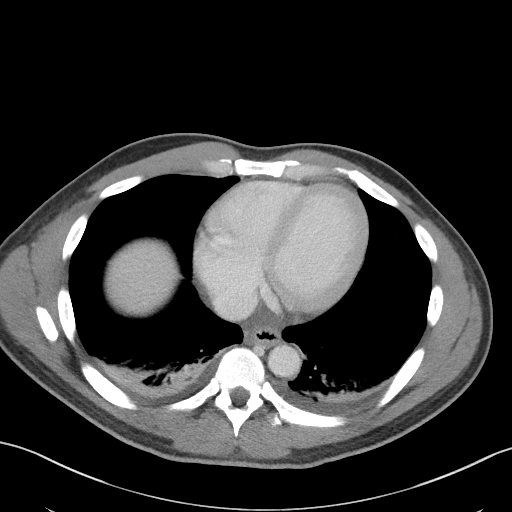

[Series 5: coronal st · coronal · 0.68mm/px · 3 of 101 slices shown]
[im 34/101  soft-tissue]
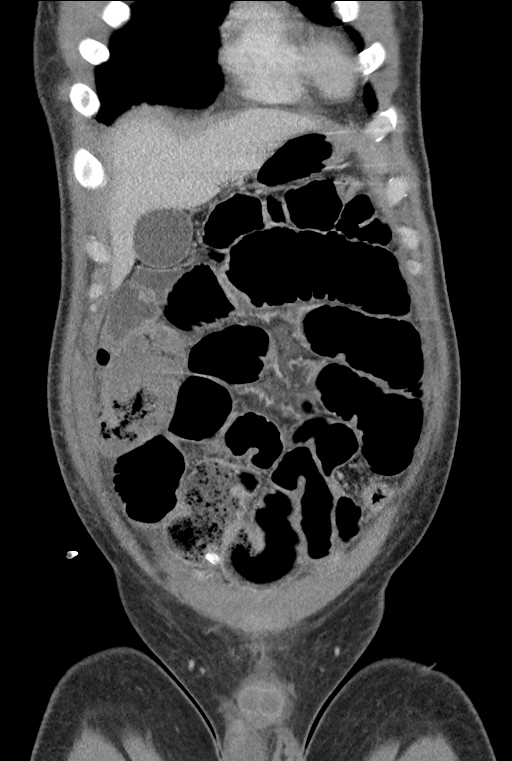
[im 45/101  soft-tissue]
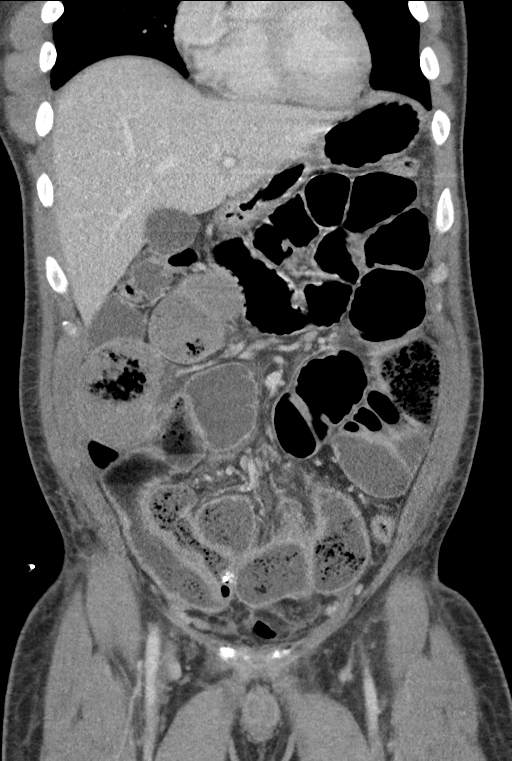
[im 56/101  soft-tissue]
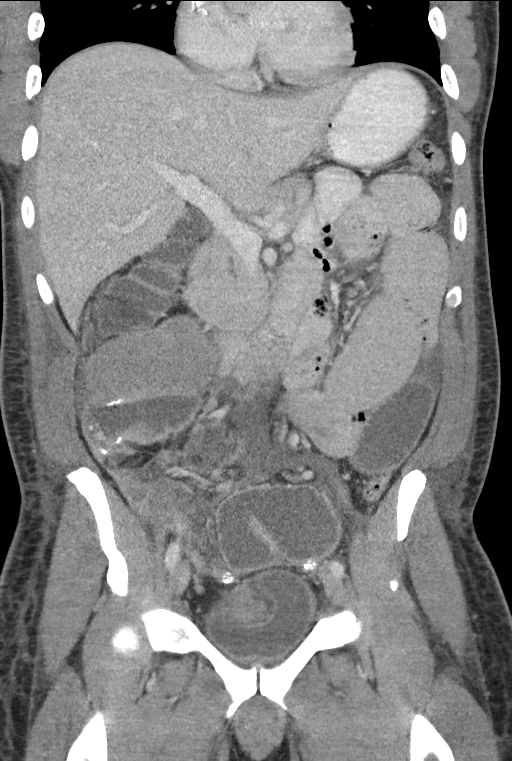

[15 of 46 positions shown; findings below may reference images not displayed]

FINDINGS: Lower chest: Small bilateral pleural effusions are present. There is
mild dependent atelectasis. No significant airspace disease is
present. Heart size is normal.

Hepatobiliary: No focal liver abnormality is seen. No gallstones,
gallbladder wall thickening, or biliary dilatation.

Pancreas: Mild pancreatic duct dilation is again noted. There is no
mass lesion. Pancreas is otherwise unremarkable.

Spleen: Insert normal spleen

Adrenals/Urinary Tract: Adrenal glands are normal bilaterally.
Kidneys and ureters are normal. Postsurgical changes are noted in
the urinary bladder

Stomach/Bowel: Oral contrast is mostly in the stomach and proximal
small bowel. Small bowel is diffusely dilated, similar the prior
study. Wall thickening in the distal small bowel is less severe than
previously seen. The colon is mostly collapsed, beyond the
anastomosis. Anastomosis appears intact. A small amount of fluid is
present adjacent to the anastomosis without suggestion of the leak.

No significant pneumatosis is present. Hartman's pouch is mostly
collapsed.

Vascular/Lymphatic: Atherosclerotic changes are present in the iliac
arteries without aneurysm. No significant retroperitoneal adenopathy
is present.

Reproductive: Prostate is unremarkable.

Other: External drain extends into the anatomic pelvis. There is
some gas in the pelvis. Minimal free fluid is noted adjacent to the
anastomosis. Laparotomy wound remains open. Ostomy is intact.

Musculoskeletal: Vertebral body heights are maintained. There is
grade 1 retrolisthesis and a vacuum disc at L5-S1. No focal lytic or
blastic lesions are present. Bony pelvis is intact. Hips are located
and within normal limits.
IMPRESSION: 1. Persistent diffuse small bowel ileus.
2. The colon distal to the anastomosis is mostly collapsed. There is
no definite mechanical obstruction.
3. Surgical drain remains in place.
4. Oral contrast is contained mostly to the stomach and proximal
small bowel, suggesting very slow transit.
5. Minimal fluid adjacent to the anastomosis is likely reactive
without a definite week.
6. Bilateral pleural effusions and associated atelectasis.
